# Patient Record
Sex: Female | Born: 1965 | Race: Black or African American | Hispanic: No | Marital: Married | Smoking: Never smoker
Health system: Southern US, Community
[De-identification: ages and names within clinical notes are randomized; demographics above are authoritative.]

## PROBLEM LIST (undated history)

## (undated) DIAGNOSIS — D219 Benign neoplasm of connective and other soft tissue, unspecified: Secondary | ICD-10-CM

## (undated) DIAGNOSIS — D72819 Decreased white blood cell count, unspecified: Secondary | ICD-10-CM

## (undated) DIAGNOSIS — D649 Anemia, unspecified: Secondary | ICD-10-CM

## (undated) DIAGNOSIS — G43909 Migraine, unspecified, not intractable, without status migrainosus: Secondary | ICD-10-CM

## (undated) DIAGNOSIS — R87619 Unspecified abnormal cytological findings in specimens from cervix uteri: Secondary | ICD-10-CM

## (undated) DIAGNOSIS — K7689 Other specified diseases of liver: Secondary | ICD-10-CM

## (undated) DIAGNOSIS — Z8719 Personal history of other diseases of the digestive system: Secondary | ICD-10-CM

## (undated) DIAGNOSIS — L821 Other seborrheic keratosis: Secondary | ICD-10-CM

## (undated) DIAGNOSIS — Z923 Personal history of irradiation: Secondary | ICD-10-CM

## (undated) DIAGNOSIS — D171 Benign lipomatous neoplasm of skin and subcutaneous tissue of trunk: Secondary | ICD-10-CM

## (undated) DIAGNOSIS — C801 Malignant (primary) neoplasm, unspecified: Secondary | ICD-10-CM

## (undated) HISTORY — PX: TUBAL LIGATION: SHX77

## (undated) HISTORY — DX: Unspecified abnormal cytological findings in specimens from cervix uteri: R87.619

## (undated) HISTORY — DX: Migraine, unspecified, not intractable, without status migrainosus: G43.909

## (undated) HISTORY — DX: Anemia, unspecified: D64.9

## (undated) HISTORY — DX: Benign neoplasm of connective and other soft tissue, unspecified: D21.9

## (undated) HISTORY — PX: ESOPHAGOGASTRODUODENOSCOPY: SHX1529

---
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
## 1984-03-06 HISTORY — PX: APPENDECTOMY: SHX54

## 1998-06-09 ENCOUNTER — Emergency Department (HOSPITAL_COMMUNITY): Admission: EM | Admit: 1998-06-09 | Discharge: 1998-06-09 | Payer: Self-pay | Admitting: Emergency Medicine

## 1998-06-09 ENCOUNTER — Encounter: Payer: Self-pay | Admitting: Emergency Medicine

## 1998-06-22 ENCOUNTER — Other Ambulatory Visit: Admission: RE | Admit: 1998-06-22 | Discharge: 1998-06-22 | Payer: Self-pay | Admitting: Obstetrics and Gynecology

## 1999-07-22 ENCOUNTER — Other Ambulatory Visit: Admission: RE | Admit: 1999-07-22 | Discharge: 1999-07-22 | Payer: Self-pay | Admitting: Obstetrics and Gynecology

## 2007-04-25 ENCOUNTER — Emergency Department (HOSPITAL_COMMUNITY): Admission: EM | Admit: 2007-04-25 | Discharge: 2007-04-25 | Payer: Self-pay | Admitting: Emergency Medicine

## 2010-11-28 LAB — URINALYSIS, ROUTINE W REFLEX MICROSCOPIC
Bilirubin Urine: NEGATIVE
Glucose, UA: NEGATIVE
Ketones, ur: NEGATIVE
Nitrite: POSITIVE — AB
Protein, ur: 30 — AB
Specific Gravity, Urine: 1.014
Urobilinogen, UA: 0.2
pH: 6

## 2010-11-28 LAB — URINE MICROSCOPIC-ADD ON

## 2010-11-28 LAB — URINE CULTURE: Colony Count: >=100000

## 2011-03-10 ENCOUNTER — Ambulatory Visit: Payer: Self-pay | Admitting: Family Medicine

## 2011-05-19 ENCOUNTER — Encounter (HOSPITAL_COMMUNITY): Payer: Self-pay | Admitting: *Deleted

## 2011-05-19 ENCOUNTER — Emergency Department (HOSPITAL_COMMUNITY)
Admission: EM | Admit: 2011-05-19 | Discharge: 2011-05-20 | Disposition: A | Discharge status: Home / Self Care | Payer: 59 | Attending: Emergency Medicine | Admitting: Emergency Medicine

## 2011-05-19 DIAGNOSIS — R51 Headache: Secondary | ICD-10-CM | POA: Insufficient documentation

## 2011-05-19 DIAGNOSIS — R110 Nausea: Secondary | ICD-10-CM | POA: Insufficient documentation

## 2011-05-19 DIAGNOSIS — R42 Dizziness and giddiness: Secondary | ICD-10-CM | POA: Insufficient documentation

## 2011-05-19 DIAGNOSIS — D649 Anemia, unspecified: Secondary | ICD-10-CM | POA: Insufficient documentation

## 2011-05-19 DIAGNOSIS — R209 Unspecified disturbances of skin sensation: Secondary | ICD-10-CM | POA: Insufficient documentation

## 2011-05-19 NOTE — ED Notes (Signed)
Pt states that she began to have a right sided headache x 5 days ago that has gotten progressively worse and worsens at the end of the day.  Pt states that the headache seems to be affecting the vision in her right eye.  Pt states she is having visual disturbances in her right eye.  Pt exhibits no neurological deficits.  Pt is noted to have a steady gate and he speech is of normal tone and cadence.  Pt states that she is also having a heavy and extended period.

## 2011-05-20 ENCOUNTER — Emergency Department (HOSPITAL_COMMUNITY): Payer: 59

## 2011-05-20 LAB — BASIC METABOLIC PANEL
Calcium: 9.2 mg/dL (ref 8.4–10.5)
GFR calc Af Amer: 73 mL/min — ABNORMAL LOW (ref >90)
GFR calc non Af Amer: 63 mL/min — ABNORMAL LOW (ref >90)
Glucose, Bld: 104 mg/dL — ABNORMAL HIGH (ref 70–99)
Sodium: 134 mEq/L — ABNORMAL LOW (ref 135–145)

## 2011-05-20 LAB — DIFFERENTIAL
Basophils Relative: 1 % (ref 0–1)
Eosinophils Absolute: 0.2 10*3/uL (ref 0.0–0.7)
Eosinophils Relative: 4 % (ref 0–5)
Lymphs Abs: 2.5 10*3/uL (ref 0.7–4.0)
Neutrophils Relative %: 39 % — ABNORMAL LOW (ref 43–77)

## 2011-05-20 LAB — CBC
MCH: 24.9 pg — ABNORMAL LOW (ref 26.0–34.0)
MCHC: 32.1 g/dL (ref 30.0–36.0)
MCV: 77.7 fL — ABNORMAL LOW (ref 78.0–100.0)
Platelets: 281 10*3/uL (ref 150–400)
RBC: 3.73 MIL/uL — ABNORMAL LOW (ref 3.87–5.11)
RDW: 17.9 % — ABNORMAL HIGH (ref 11.5–15.5)

## 2011-05-20 MED ORDER — FERROUS SULFATE 325 (65 FE) MG PO TABS: 325.0000 mg | ORAL_TABLET | Freq: Every day | ORAL | Status: DC

## 2011-05-20 MED ORDER — DEXAMETHASONE SODIUM PHOSPHATE 10 MG/ML IJ SOLN
10.0000 mg | Freq: Once | INTRAMUSCULAR | Status: AC
  Administered 2011-05-20: 10 mg via INTRAVENOUS
  Filled 2011-05-20: qty 1

## 2011-05-20 MED ORDER — DIPHENHYDRAMINE HCL 50 MG/ML IJ SOLN
25.0000 mg | Freq: Once | INTRAMUSCULAR | Status: AC
  Administered 2011-05-20: 25 mg via INTRAVENOUS
  Filled 2011-05-20: qty 1

## 2011-05-20 MED ORDER — METOCLOPRAMIDE HCL 5 MG/ML IJ SOLN
10.0000 mg | Freq: Once | INTRAMUSCULAR | Status: AC
  Administered 2011-05-20: 10 mg via INTRAVENOUS
  Filled 2011-05-20: qty 2

## 2011-05-20 NOTE — ED Provider Notes (Signed)
History     CSN: [id number]  Arrival date & time 05/19/11  2004   First MD Initiated Contact with Patient 05/20/11 0012      Chief Complaint  Patient presents with  . Headache     HPI  History provided by the patient. Patient is a 46 year old female with no significant past medical history who presents with complaints of rt sided headache for the past 5 days.  Pain is located primarily in posterior head and radiates around right side to temple area.  HA has been persistent and gradually worsening.  Pain is especially bad in evening time.  Pt has taken OTC pain meds with no significant relief.  Pain has been associated at times with blurry vision from rt eye and one episodes of N/V yesterday.  Pt does mention that headaches do feel similar to HA she had gotten when she was a teenager.  Pt denies any other associated symptoms.  No fever, chills, sweats, confusion, changes in speech, numbness or weakness in extremities.  Pt also mentions that she feels she may be going through early menopause.  She reports having long periods of vaginal bleeding up to 3 wks over the past 6 months to year.  Currently she denies vaginal bleeding.  She does state that she was concerned her blood levels may be low and adding to her HA symptoms.    History reviewed. No pertinent past medical history.  Past Surgical History  Procedure Date  . Breast lumpectomy     bilateral  . Cesarean section     x 3  . Appendectomy     No family history on file.  History  Substance Use Topics  . Smoking status: Not on file  . Smokeless tobacco: Not on file  . Alcohol Use:     OB History    Grav Para Term Preterm Abortions TAB SAB Ect Mult Living                  Review of Systems  Constitutional: Negative for fever and chills.  Eyes: Positive for visual disturbance. Negative for photophobia.  Cardiovascular: Negative for chest pain.  Gastrointestinal: Positive for nausea. Negative for abdominal pain.    Neurological: Positive for headaches. Negative for dizziness, syncope, speech difficulty, weakness, light-headedness and numbness.  All other systems reviewed and are negative.    Allergies  Review of patient's allergies indicates no known allergies.  Home Medications   Current Outpatient Rx  Name Route Sig Dispense Refill  . IBUPROFEN 200 MG PO TABS Oral Take 400 mg by mouth every 6 (six) hours as needed.      BP 115/71  Pulse 58  Temp(Src) 98.7 F (37.1 C) (Oral)  Resp 14  Wt 133 lb 6.4 oz (60.51 kg)  SpO2 100%  LMP 05/12/2011  Physical Exam  Nursing note and vitals reviewed. Constitutional: She is oriented to person, place, and time. She appears well-developed and well-nourished. No distress.  HENT:  Head: Normocephalic and atraumatic.  Mouth/Throat: Oropharynx is clear and moist.  Eyes: Conjunctivae are normal. Pupils are equal, round, and reactive to light.  Neck: Normal range of motion. Neck supple.       No meningeal signs  Cardiovascular: Normal rate and regular rhythm.   Pulmonary/Chest: Effort normal and breath sounds normal. No respiratory distress. She has no rales.  Abdominal: Soft. She exhibits no distension. There is no tenderness. There is no rebound.  Neurological: She is alert and oriented to person, place,  and time. She has normal strength. No cranial nerve deficit or sensory deficit. Coordination and gait normal.  Skin: Skin is warm and dry. No rash noted.  Psychiatric: She has a normal mood and affect. Her behavior is normal.    ED Course  Procedures   Results for orders placed during the hospital encounter of 05/19/11  CBC      Component Value Range   WBC 4.9  4.0 - 10.5 (K/uL)   RBC 3.73 (*) 3.87 - 5.11 (MIL/uL)   Hemoglobin 9.3 (*) 12.0 - 15.0 (g/dL)   HCT 19.1 (*) 47.8 - 46.0 (%)   MCV 77.7 (*) 78.0 - 100.0 (fL)   MCH 24.9 (*) 26.0 - 34.0 (pg)   MCHC 32.1  30.0 - 36.0 (g/dL)   RDW 29.5 (*) 62.1 - 15.5 (%)   Platelets 281  150 - 400  (K/uL)  DIFFERENTIAL      Component Value Range   Neutrophils Relative 39 (*) 43 - 77 (%)   Neutro Abs 1.9  1.7 - 7.7 (K/uL)   Lymphocytes Relative 51 (*) 12 - 46 (%)   Lymphs Abs 2.5  0.7 - 4.0 (K/uL)   Monocytes Relative 6  3 - 12 (%)   Monocytes Absolute 0.3  0.1 - 1.0 (K/uL)   Eosinophils Relative 4  0 - 5 (%)   Eosinophils Absolute 0.2  0.0 - 0.7 (K/uL)   Basophils Relative 1  0 - 1 (%)   Basophils Absolute 0.0  0.0 - 0.1 (K/uL)  BASIC METABOLIC PANEL      Component Value Range   Sodium 134 (*) 135 - 145 (mEq/L)   Potassium 3.7  3.5 - 5.1 (mEq/L)   Chloride 102  96 - 112 (mEq/L)   CO2 27  19 - 32 (mEq/L)   Glucose, Bld 104 (*) 70 - 99 (mg/dL)   BUN 16  6 - 23 (mg/dL)   Creatinine, Ser 3.08  0.50 - 1.10 (mg/dL)   Calcium 9.2  8.4 - 65.7 (mg/dL)   GFR calc non Af Amer 63 (*) >90 (mL/min)   GFR calc Af Amer 73 (*) >90 (mL/min)      Ct Head Wo Contrast  05/20/2011  *RADIOLOGY REPORT*  Clinical Data: Right occipital headache; left leg numbness and dizziness.  CT HEAD WITHOUT CONTRAST  Technique:  Contiguous axial images were obtained from the base of the skull through the vertex without contrast.  Comparison: None.  Findings: There is no evidence of acute infarction, mass lesion, or intra- or extra-axial hemorrhage on CT.  A small chronic lacunar infarct is noted within the left putamen.  The posterior fossa, including the cerebellum, brainstem and fourth ventricle, is within normal limits.  The third and lateral ventricles are unremarkable in appearance.  The cerebral hemispheres are symmetric in appearance, with normal gray-white differentiation.  No mass effect or midline shift is seen.  There is no evidence of fracture; visualized osseous structures are unremarkable in appearance.  The visualized portions of the orbits are within normal limits.  The paranasal sinuses and mastoid air cells are well-aerated.  No significant soft tissue abnormalities are seen.  IMPRESSION:  1.  No  acute intracranial pathology seen on CT. 2.  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  Original Report Authenticated By: Tonia Ghent, M.D.     1. Headache   2. Anemia       MDM  Patient seen and evaluated. Patient in no acute distress. Patient with normal  nonfocal neuro exam.   Patient discussed with attending physician. CT scan unremarkable for acute findings. Symptoms seem similar to a complex migraine type headache. Plan to treat headache symptoms. Patient with slight anemia with no prior labs for comparison. Patient currently denies any menstrual bleeding. Will advise patient on iron rich diet and possible ion supplements given low MCV. Patient denies any lightheadedness or near syncopal symptoms.        Angus Seller, [place] 05/21/11 [phone]

## 2011-05-20 NOTE — Discharge Instructions (Signed)
Your CAT scan today do not show any concerning signs to explain your symptoms of headache. Your lab tests today did show you have a slight anemia and low blood counts. It is recommended that you eat a diet high in iron and consider taking an iron supplement for your symptoms. Please followup to primary care provider for continued evaluation and recheck severe blood levels. If you develop any worsening symptoms, persistent or increased headache pain, fever, chills, numbness or weakness in extremities please return to the emergency room.   Anemia, Frequently Asked Questions WHAT ARE THE SYMPTOMS OF ANEMIA?  Headache.   Difficulty thinking.   Fatigue.   Shortness of breath.   Weakness.   Rapid heartbeat.  AT WHAT POINT ARE PEOPLE CONSIDERED ANEMIC?  This varies with gender and age.   Both hemoglobin (Hgb) and hematocrit values are used to define anemia. These lab values are obtained from a complete blood count (CBC) test. This is performed at a caregiver's office.   The normal range of hemoglobin values for adult men is 14.0 g/dL to 16.1 g/dL. For nonpregnant women, values are 12.3 g/dL to 09.6 g/dL.   The World Health Organization defines anemia as less than 12 g/dL for nonpregnant women and less than 13 g/dL for men.   For adult males, the average normal hematocrit is 46%, and the range is 40% to 52%.   For adult females, the average normal hematocrit is 41%, and the range is 35% to 47%.   Values that fall below the lower limits can be a sign of anemia and should have further checking (evaluation).  GROUPS OF PEOPLE WHO ARE AT RISK FOR DEVELOPING ANEMIA INCLUDE:   Infants who are breastfed or taking a formula that is not fortified with iron.   Children going through a rapid growth spurt. The iron available can not keep up with the needs for a red cell mass which must grow with the child.   Women in childbearing years. They need iron because of blood loss during menstruation.    Pregnant women. The growing fetus creates a high demand for iron.   People with ongoing gastrointestinal blood loss are at risk of developing iron deficiency.   Individuals with leukemia or cancer who must receive chemotherapy or radiation to treat their disease. The drugs or radiation used to treat these diseases often decreases the bone marrow's ability to make cells of all classes. This includes red blood cells, white blood cells, and platelets.   Individuals with chronic inflammatory conditions such as rheumatoid arthritis or chronic infections.   The elderly.  ARE SOME TYPES OF ANEMIA INHERITED?   Yes, some types of anemia are due to inherited or genetic defects.   Sickle cell anemia. This occurs most often in people of African, African American, and Mediterranean descent.   Thalassemia (or Cooley's anemia). This type is found in people of Mediterranean and Southeast Asian descent. These types of anemia are common.   [place]i. This is rare.  CAN CERTAIN MEDICATIONS CAUSE A PERSON TO BECOME ANEMIC?  Yes. For example, drugs to fight cancer (chemotherapeutic agents) often cause anemia. These drugs can slow the bone marrow's ability to make red blood cells. If there are not enough red blood cells, the body does not get enough oxygen. WHAT HEMATOCRIT LEVEL IS REQUIRED TO DONATE BLOOD?  The lower limit of an acceptable hematocrit for blood donors is 38%. If you have a low hematocrit value, you should schedule an appointment with your caregiver.  ARE BLOOD TRANSFUSIONS COMMONLY USED TO CORRECT ANEMIA, AND ARE THEY DANGEROUS?  They are used to treat anemia as a last resort. Your caregiver will find the cause of the anemia and correct it if possible. Most blood transfusions are given because of excessive bleeding at the time of surgery, with trauma, or because of bone marrow suppression in patients with cancer or leukemia on chemotherapy. Blood transfusions are safer than ever before. We also  know that blood transfusions affect the immune system and may increase certain risks. There is also a concern for human error. In 1/16,000 transfusions, a patient receives a transfusion of blood that is not matched with his or her blood type.  WHAT IS IRON DEFICIENCY ANEMIA AND CAN I CORRECT IT BY CHANGING MY DIET?  Iron is an essential part of hemoglobin. Without enough hemoglobin, anemia develops and the body does not get the right amount of oxygen. Iron deficiency anemia develops after the body has had a low level of iron for a long time. This is either caused by blood loss, not taking in or absorbing enough iron, or increased demands for iron (like pregnancy or rapid growth).  Foods from animal origin such as beef, chicken, and pork, are good sources of iron. Be sure to have one of these foods at each meal. Vitamin C helps your body absorb iron. Foods rich in Vitamin C include citrus, bell pepper, strawberries, spinach and cantaloupe. In some cases, iron supplements may be needed in order to correct the iron deficiency. In the case of poor absorption, extra iron may have to be given directly into the vein through a needle (intravenously). I HAVE BEEN DIAGNOSED WITH IRON DEFICIENCY ANEMIA AND MY CAREGIVER PRESCRIBED IRON SUPPLEMENTS. HOW LONG WILL IT TAKE FOR MY BLOOD TO BECOME NORMAL?  It depends on the degree of anemia at the beginning of treatment. Most people with mild to moderate iron deficiency, anemia will correct the anemia over a period of 2 to 3 months. But after the anemia is corrected, the iron stored by the body is still low. Caregivers often suggest an additional 6 months of oral iron therapy once the anemia has been reversed. This will help prevent the iron deficiency anemia from quickly happening again. Non-anemic adult males should take iron supplements only under the direction of a doctor, too much iron can cause liver damage.  MY HEMOGLOBIN IS 9 G/DL AND I AM SCHEDULED FOR SURGERY. SHOULD  I POSTPONE THE SURGERY?  If you have Hgb of 9, you should discuss this with your caregiver right away. Many patients with similar hemoglobin levels have had surgery without problems. If minimal blood loss is expected for a minor procedure, no treatment may be necessary.  If a greater blood loss is expected for more extensive procedures, you should ask your caregiver about being treated with erythropoietin and iron. This is to accelerate the recovery of your hemoglobin to a normal level before surgery. An anemic patient who undergoes high-blood-loss surgery has a greater risk of surgical complications and need for a blood transfusion, which also carries some risk.  I HAVE BEEN TOLD THAT HEAVY MENSTRUAL PERIODS CAUSE ANEMIA. IS THERE ANYTHING I CAN DO TO PREVENT THE ANEMIA?  Anemia that results from heavy periods is usually due to iron deficiency. You can try to meet the increased demands for iron caused by the heavy monthly blood loss by increasing the intake of iron-rich foods. Iron supplements may be required. Discuss your concerns with your caregiver. WHAT  CAUSES ANEMIA DURING PREGNANCY?  Pregnancy places major demands on the body. The mother must meet the needs of both her body and her growing baby. The body needs enough iron and folate to make the right amount of red blood cells. To prevent anemia while pregnant, the mother should stay in close contact with her caregiver.  Be sure to eat a diet that has foods rich in iron and folate like liver and dark green leafy vegetables. Folate plays an important role in the normal development of a baby's spinal cord. Folate can help prevent serious disorders like spina bifida. If your diet does not provide adequate nutrients, you may want to talk with your caregiver about nutritional supplements.  WHAT IS THE RELATIONSHIP BETWEEN FIBROID TUMORS AND ANEMIA IN WOMEN?  The relationship is usually caused by the increased menstrual blood loss caused by fibroids. Good  iron intake may be required to prevent iron deficiency anemia from developing.  Document Released: 09/29/2003 Document Revised: 02/09/2011 Document Reviewed: 03/15/2010 [facility] Patient Information 2012 [place], [place].  Headache, General, Unknown Cause The specific cause of your headache may not have been found today. There are many causes and types of headache. A few common ones are:  Tension headache.   Migraine.   Infections (examples: dental and sinus infections).   Bone and/or joint problems in the neck or jaw.   Depression.   Eye problems.  These headaches are not life threatening.  Headaches can sometimes be diagnosed by a patient history and a physical exam. Sometimes, lab and imaging studies (such as x-ray and/or CT scan) are used to rule out more serious problems. In some cases, a spinal tap (lumbar puncture) may be requested. There are many times when your exam and tests may be normal on the first visit even when there is a serious problem causing your headaches. Because of that, it is very important to follow up with your doctor or local clinic for further evaluation. FINDING OUT THE RESULTS OF TESTS  If a radiology test was performed, a radiologist will review your results.   You will be contacted by the emergency department or your physician if any test results require a change in your treatment plan.   Not all test results may be available during your visit. If your test results are not back during the visit, make an appointment with your caregiver to find out the results. Do not assume everything is normal if you have not heard from your caregiver or the medical facility. It is important for you to follow up on all of your test results.  HOME CARE INSTRUCTIONS   Keep follow-up appointments with your caregiver, or any specialist referral.   Only take over-the-counter or prescription medicines for pain, discomfort, or fever as directed by your caregiver.   Biofeedback,  massage, or other relaxation techniques may be helpful.   Ice packs or heat applied to the head and neck can be used. Do this three to four times per day, or as needed.   Call your doctor if you have any questions or concerns.   If you smoke, you should quit.  SEEK MEDICAL CARE IF:   You develop problems with medications prescribed.   You do not respond to or obtain relief from medications.   You have a change from the usual headache.   You develop nausea or vomiting.  SEEK IMMEDIATE MEDICAL CARE IF:   If your headache becomes severe.   You have an unexplained oral temperature above  102 F (38.9 C), or as your caregiver suggests.   You have a stiff neck.   You have loss of vision.   You have muscular weakness.   You have loss of muscular control.   You develop severe symptoms different from your first symptoms.   You start losing your balance or have trouble walking.   You feel faint or pass out.  MAKE SURE YOU:   Understand these instructions.   Will watch your condition.   Will get help right away if you are not doing well or get worse.  Document Released: 02/20/2005 Document Revised: 02/09/2011 Document Reviewed: 10/10/2007 [facility] Patient Information 2012 [place], [place].

## 2011-05-26 NOTE — ED Provider Notes (Signed)
Medical screening examination/treatment/procedure(s) were performed by non-physician practitioner and as supervising physician I was immediately available for consultation/collaboration.  Anubis Fundora, MD 05/26/11 1926 

## 2012-03-06 DIAGNOSIS — D649 Anemia, unspecified: Secondary | ICD-10-CM

## 2012-03-06 HISTORY — DX: Anemia, unspecified: D64.9

## 2012-03-06 HISTORY — PX: ABDOMINAL HYSTERECTOMY: SHX81

## 2012-06-26 ENCOUNTER — Telehealth: Payer: Self-pay | Admitting: Obstetrics & Gynecology

## 2012-06-26 NOTE — Telephone Encounter (Signed)
Left message to return call 

## 2012-06-27 NOTE — Telephone Encounter (Signed)
PATIENT STATES HAS HAD UTI, TOOK OTC MEDS AZO, APPT. GIVEN 06/28/2012 @ 12:15pm

## 2012-06-28 ENCOUNTER — Ambulatory Visit: Payer: Self-pay | Admitting: Obstetrics and Gynecology

## 2012-06-28 ENCOUNTER — Ambulatory Visit: Payer: Self-pay | Admitting: Gynecology

## 2012-07-01 ENCOUNTER — Ambulatory Visit (INDEPENDENT_AMBULATORY_CARE_PROVIDER_SITE_OTHER): Payer: BC Managed Care – PPO | Admitting: Gynecology

## 2012-07-01 ENCOUNTER — Encounter: Payer: Self-pay | Admitting: Gynecology

## 2012-07-01 VITALS — BP 116/74 | Temp 97.6°F | Resp 14

## 2012-07-01 DIAGNOSIS — R300 Dysuria: Secondary | ICD-10-CM

## 2012-07-01 DIAGNOSIS — D509 Iron deficiency anemia, unspecified: Secondary | ICD-10-CM

## 2012-07-01 DIAGNOSIS — D259 Leiomyoma of uterus, unspecified: Secondary | ICD-10-CM | POA: Insufficient documentation

## 2012-07-01 LAB — POCT URINALYSIS DIPSTICK
Blood, UA: 2
Urobilinogen, UA: NEGATIVE
pH, UA: 5

## 2012-07-01 LAB — CBC
Hemoglobin: 13.1 g/dL (ref 12.0–15.0)
MCH: 28.5 pg (ref 26.0–34.0)
MCV: 85 fL (ref 78.0–100.0)
RBC: 4.59 MIL/uL (ref 3.87–5.11)

## 2012-07-01 MED ORDER — NITROFURANTOIN MONOHYD MACRO 100 MG PO CAPS: 100.0000 mg | ORAL_CAPSULE | Freq: Two times a day (BID) | ORAL | Status: DC

## 2012-07-01 MED ORDER — NORETHINDRONE ACETATE 5 MG PO TABS: 5.0000 mg | ORAL_TABLET | Freq: Every day | ORAL | Status: DC

## 2012-07-01 NOTE — Patient Instructions (Signed)
Urinary Tract Infection Urinary tract infections (UTIs) can develop anywhere along your urinary tract. Your urinary tract is your body's drainage system for removing wastes and extra water. Your urinary tract includes two kidneys, two ureters, a bladder, and a urethra. Your kidneys are a pair of bean-shaped organs. Each kidney is about the size of your fist. They are located below your ribs, one on each side of your spine. CAUSES Infections are caused by microbes, which are microscopic organisms, including fungi, viruses, and bacteria. These organisms are so small that they can only be seen through a microscope. Bacteria are the microbes that most commonly cause UTIs. SYMPTOMS  Symptoms of UTIs may vary by age and gender of the patient and by the location of the infection. Symptoms in young women typically include a frequent and intense urge to urinate and a painful, burning feeling in the bladder or urethra during urination. Older women and men are more likely to be tired, shaky, and weak and have muscle aches and abdominal pain. A fever may mean the infection is in your kidneys. Other symptoms of a kidney infection include pain in your back or sides below the ribs, nausea, and vomiting. DIAGNOSIS To diagnose a UTI, your caregiver will ask you about your symptoms. Your caregiver also will ask to provide a urine sample. The urine sample will be tested for bacteria and white blood cells. White blood cells are made by your body to help fight infection. TREATMENT  Typically, UTIs can be treated with medication. Because most UTIs are caused by a bacterial infection, they usually can be treated with the use of antibiotics. The choice of antibiotic and length of treatment depend on your symptoms and the type of bacteria causing your infection. HOME CARE INSTRUCTIONS  If you were prescribed antibiotics, take them exactly as your caregiver instructs you. Finish the medication even if you feel better after you  have only taken some of the medication.  Drink enough water and fluids to keep your urine clear or pale yellow.  Avoid caffeine, tea, and carbonated beverages. They tend to irritate your bladder.  Empty your bladder often. Avoid holding urine for long periods of time.  Empty your bladder before and after sexual intercourse.  After a bowel movement, women should cleanse from front to back. Use each tissue only once. SEEK MEDICAL CARE IF:   You have back pain.  You develop a fever.  Your symptoms do not begin to resolve within 3 days. SEEK IMMEDIATE MEDICAL CARE IF:   You have severe back pain or lower abdominal pain.  You develop chills.  You have nausea or vomiting.  You have continued burning or discomfort with urination. MAKE SURE YOU:   Understand these instructions.  Will watch your condition.  Will get help right away if you are not doing well or get worse. Document Released: 11/30/2004 Document Revised: 08/22/2011 Document Reviewed: 03/31/2011 [employer] Patient Information 2013 [employer].  

## 2012-07-01 NOTE — Progress Notes (Signed)
  Subjective:    Samantha West is a 47 y.o. female who complains of dysuria. She has had symptoms for 7 days. Patient just started cycle today. Patient denies pelvic pain, frequency or urgency. Patient does not have a history of recurrent UTI. Patient does not have a history of pyelonephritis. Pt feeling somewhat better since starting azo OTC. And cranberry tablet. Pt reports menses q2-3 month, variable flow but 4d, pt reports bloating and fullness and heaviness.  Pt did not follow up for her anemia, still taking iron daily  The following portions of the patient's history were reviewed and updated as appropriate: allergies, current medications, past family history, past medical history, past social history, past surgical history and problem list.  Review of Systems Pertinent items are noted in HPI.    Objective:    BP 116/74  Temp(Src) 97.6 F (36.4 C)  Resp 14  LMP 07/01/2012 General appearance: alert, cooperative and appears stated age Pelvic: cervix normal in appearance, external genitalia normal, no adnexal masses or tenderness, no bladder tenderness, no cervical motion tenderness and uterus bulky consistant with fibroids- approx 12w week, nontender  Laboratory:  Urine dipstick: negative for glucose, 2+ for hemoglobin, negative for ketones, 2+ for leukocyte esterase, negative for nitrites, trace for protein and negative for urobilinogen.   Micro exam: not done.    Assessment:    Acute cystitis   Fibroid uterus with anemia   Plan:    Medications: nitrofurantoin. Maintain adequate hydration. Follow up if symptoms not improving, and as needed.  Discussed follow up regarding her anemia and symptomatic fibroids- pt would like to schedule for after daughter's wedding in May, discussed robotic hyst as she has had 3 c/s, briefly discussed procedure.  Will check CBC now

## 2012-07-03 ENCOUNTER — Other Ambulatory Visit: Payer: Self-pay | Admitting: Gynecology

## 2012-07-03 DIAGNOSIS — N390 Urinary tract infection, site not specified: Secondary | ICD-10-CM

## 2012-07-03 LAB — CULTURE, URINE COMPREHENSIVE

## 2012-07-03 MED ORDER — CIPROFLOXACIN HCL 500 MG PO TABS: 500.0000 mg | ORAL_TABLET | Freq: Two times a day (BID) | ORAL | Status: DC

## 2012-07-04 ENCOUNTER — Telehealth: Payer: Self-pay | Admitting: *Deleted

## 2012-07-04 NOTE — Telephone Encounter (Signed)
Left Message To Call Back  

## 2012-07-04 NOTE — Telephone Encounter (Signed)
Message copied by Lorraine Lax on Thu Jul 04, 2012  1:30 PM ------      Message from: Douglass Rivers      Created: Wed Jul 03, 2012  9:56 PM       Pt given macrobid at office visit, not sensitive,  called in cipro,  D/c other abx ------

## 2012-07-08 ENCOUNTER — Telehealth: Payer: Self-pay | Admitting: *Deleted

## 2012-07-08 NOTE — Telephone Encounter (Signed)
Differences between hysterectomy and myomectomy were reviewed, pt is not interested in childbearing, pt feels pressure and bloating above her pant line consistent with her fibroid on her last exam. Questions addressed

## 2012-07-08 NOTE — Telephone Encounter (Signed)
Dr Farrel Gobble spoke to patient. Left new message on VM to check on date preferences for surgery.

## 2012-07-08 NOTE — Telephone Encounter (Signed)
Call to patient to check on date preferences for surgery.  Patient states she needs to discuss surgery with Dr Farrel Gobble as she does not understand why we are scheduling hysterectomy instead of "removing fibroid".  Please call patient to clarify.

## 2012-07-09 ENCOUNTER — Telehealth: Payer: Self-pay | Admitting: *Deleted

## 2012-07-09 NOTE — Telephone Encounter (Signed)
Left Message To Call Back  

## 2012-07-09 NOTE — Telephone Encounter (Signed)
Left Message To Call Back Re: leaving urine for toc thurs/frid

## 2012-07-11 NOTE — Telephone Encounter (Signed)
Pt.notified of lab results--see result note. 

## 2012-07-15 NOTE — Telephone Encounter (Signed)
Left Message To Call Back  

## 2012-07-18 NOTE — Telephone Encounter (Signed)
Patient states she ahs been sick and unable to talk so hasn't been able to return our calls.  States she will plan surgery in August but is not yet sure exactly when she wants to schedule.  She will call back next week after discussing options with employer.  Advised surgery is done on Tues and Wed and need 6-8 weeks for scheduling.

## 2012-07-24 NOTE — Telephone Encounter (Signed)
Patient wanted to speak specifically with Kinnie Scales in regards to her urine sample; she spoke with her previously.

## 2012-07-24 NOTE — Telephone Encounter (Signed)
Pt. Aware to drop off urine for urine toc per Dr. Seth Bake scheduled her for 07/25/12 @ 3:30

## 2012-07-25 ENCOUNTER — Ambulatory Visit (INDEPENDENT_AMBULATORY_CARE_PROVIDER_SITE_OTHER): Payer: BC Managed Care – PPO | Admitting: Gynecology

## 2012-07-25 VITALS — BP 100/60

## 2012-07-25 DIAGNOSIS — N390 Urinary tract infection, site not specified: Secondary | ICD-10-CM

## 2012-07-25 NOTE — Progress Notes (Signed)
Pt. Is here to leave urine to be sent for a urine culture, pt was last seen 07/01/12 for a urine culture. Per dr. Farrel Gobble pt needed to leave sample to be sent for culture. Pt says she feels much better.  Urine sent for culture.

## 2012-07-30 ENCOUNTER — Telehealth: Payer: Self-pay | Admitting: *Deleted

## 2012-07-30 NOTE — Telephone Encounter (Signed)
Left Message To Call Back  

## 2012-07-30 NOTE — Telephone Encounter (Signed)
Message copied by Lorraine Lax on Tue Jul 30, 2012  8:14 AM ------      Message from: Douglass Rivers      Created: Sat Jul 27, 2012  6:39 PM       inform ------

## 2012-08-01 NOTE — Telephone Encounter (Signed)
Left Message To Call Back  

## 2012-08-02 NOTE — Telephone Encounter (Signed)
Pt. Notified of labs 08/01/12

## 2012-08-22 ENCOUNTER — Telehealth: Payer: Self-pay | Admitting: Gynecology

## 2012-08-22 NOTE — Telephone Encounter (Signed)
Patient wants to discuss surgery.

## 2012-08-23 ENCOUNTER — Telehealth: Payer: Self-pay | Admitting: Gynecology

## 2012-08-23 NOTE — Telephone Encounter (Signed)
Patient is waiting on call to schedule surgery, please call patient is ready to schedule.

## 2012-08-23 NOTE — Telephone Encounter (Signed)
Prescription agestin  ? (generic)

## 2012-08-26 MED ORDER — NORETHINDRONE ACETATE 5 MG PO TABS: ORAL_TABLET | ORAL | Status: DC

## 2012-08-26 NOTE — Telephone Encounter (Signed)
Patient calling back about refill for Aygestin that was not called to pharmacy.  She reports that she was on Aygestin to control menses.  Menses started on 08-17-12, after completing Aygetsin and she has been bleeding since then.  Since she did not hear from [place], she restarted Aygestin that she had left over from first RX.  She took it 3 times daily on 6-21 and 08-25-12 and she is feeling the bleeding getting better.  Crampis continue but clotting id not as bad.  Needs refill on Aygestin and instructions on when and how much to continue taking.  Does not want to plan the surgery until the first week of August.

## 2012-08-26 NOTE — Telephone Encounter (Signed)
Patient wants RX to [facility].  Sent case request for 10-08-12 thru [employer].

## 2012-08-26 NOTE — Telephone Encounter (Signed)
See next telephone note regarding medication and surgery date.

## 2012-08-26 NOTE — Telephone Encounter (Signed)
Verbal per Dr. Farrel Gobble: Pt should take Aygestin TID until bleeding stops, then take BID x 1 week, then take one daily until surgery in August.   RX for #50 Aygestin escribed to [facility] per Dr. Farrel Gobble. Message left at home number for patient to return call on Tuesday, 6/24.

## 2012-08-26 NOTE — Telephone Encounter (Signed)
Patient returning Sally's call. Also reports heavy bleeding and left a message on Friday. Please advise?

## 2012-08-27 NOTE — Telephone Encounter (Signed)
Pt returned call.  Notified of Dr. Liliana Cline advice.  She is agreeable and can recited instructions.  Advised we will be calling her with final surgery details at a later date.

## 2012-08-29 ENCOUNTER — Telehealth: Payer: Self-pay | Admitting: *Deleted

## 2012-08-29 NOTE — Telephone Encounter (Signed)
Call to patient with surgical date and to give instruction. LMTCB.

## 2012-08-29 NOTE — Telephone Encounter (Signed)
See next telephone call for surgery info.

## 2012-09-13 ENCOUNTER — Encounter: Payer: Self-pay | Admitting: Gynecology

## 2012-09-13 ENCOUNTER — Ambulatory Visit (INDEPENDENT_AMBULATORY_CARE_PROVIDER_SITE_OTHER): Payer: BC Managed Care – PPO | Admitting: Gynecology

## 2012-09-13 VITALS — BP 106/62 | HR 64 | Temp 98.5°F | Ht 62.0 in | Wt 133.0 lb

## 2012-09-13 DIAGNOSIS — D259 Leiomyoma of uterus, unspecified: Secondary | ICD-10-CM

## 2012-09-13 DIAGNOSIS — D509 Iron deficiency anemia, unspecified: Secondary | ICD-10-CM

## 2012-09-13 MED ORDER — HYDROMORPHONE HCL 2 MG PO TABS: 2.0000 mg | ORAL_TABLET | ORAL | Status: DC | PRN

## 2012-09-13 MED ORDER — CELECOXIB 200 MG PO CAPS: 200.0000 mg | ORAL_CAPSULE | Freq: Two times a day (BID) | ORAL | Status: DC

## 2012-09-13 NOTE — Progress Notes (Signed)
46 y.o.SingleAfrican American [id number] female here for consideration for robotically assisted TLH  with Left salpingectomy and without removal of ovaries.    She complains of dysmenorrhea, uterine leiomyomata/fibroids, menorrhagia and anemia. PUS on November 17, 2011 showed the uterus to be enlarged and contain 3 fibroids, the largest being about 6 cm.  Adnexa Normal.  Endometrial biopsy on November 24, 2011 was degenerating secretroy endometrium.        The current method of family planning is tubal ligation.     Hormones:Progesterone: Norethindrone 5 mg BID   reports that she has never smoked. She has never used smokeless tobacco. She reports that she does not drink alcohol or use illicit drugs.  @CHLEC1NM@  Health Maintenance  Topic Date Due  . Pap Smear  09/26/1982  . Tetanus/tdap  09/26/1983  . Influenza Vaccine  11/04/2012    History reviewed. No pertinent family history.  Patient Active Problem List   Diagnosis Date Noted  . Anemia, iron deficiency 07/01/2012  . Fibroid uterus 07/01/2012    History reviewed. No pertinent past medical history.  Past Surgical History  Procedure Laterality Date  . Breast lumpectomy      bilateral  . Cesarean section      x 3  . Appendectomy      Allergies: @RRALLERGIES@  Current Outpatient Prescriptions  Medication Sig Dispense Refill  . ibuprofen (ADVIL,MOTRIN) 200 MG tablet Take 400 mg by mouth every 6 (six) hours as needed.      . IRON PO Take by mouth.      . norethindrone (AYGESTIN) 5 MG tablet Take TID until bleeding stops, then take BID x 1 week, then take QD.  50 tablet  0  . celecoxib (CELEBREX) 200 MG capsule Take 1 capsule (200 mg total) by mouth 2 (two) times daily.  3 capsule  3   No current facility-administered medications for this visit.      Exam:    BP 106/62  Pulse 64  Temp(Src) 98.5 F (36.9 C) (Oral)  Ht 5' 2" (1.575 m)  Wt 133 lb (60.328 kg)  BMI 24.32 kg/m2  LMP 08/25/2012  General appearance:  alert, cooperative and appears stated age Head: Normocephalic, without obvious abnormality, atraumatic Neck: no adenopathy, no carotid bruit, no JVD, supple, symmetrical, trachea midline and thyroid not enlarged, symmetric, no tenderness/mass/nodules Lungs: clear to auscultation bilaterally Heart: regular rate and rhythm, S1, S2 normal, no murmur, click, rub or gallop Abdomen: soft, non-tender; bowel sounds normal; no masses,  no organomegaly Extremities: extremities normal, atraumatic, no cyanosis or edema Lymph nodes: Cervical, supraclavicular, and axillary nodes normal. no inguinal nodes palpated Neurologic: Grossly normal   Pelvic: External genitalia:  no lesions              Bartholins and Skenes: normal                 Vagina: normal appearing vagina with normal color and discharge, no lesions              Cervix: normal appearance                     Bimanual Exam:  Uterus:  enlarged to 12 week's size, irregular                                      Adnexa:    Not palpable                                        Rectovaginal: Deferred                                      Anus:  defer exam   A/P:  The planned procedure was discussed with the patient.  Pre-op instructions, hospital course, and post-op instructions reviewed.  Post op instruction booklet given and reviewed.  Risks and possible complications discussed, including but not limited to, bleeding and possible transfusion; infection; anesthesia complications; injury to visceral organ requiring further surgery, either immediately or delayed; wound complications including infection, blood collections, and bowel herniation; allergic reactions; swelling of the face due to prolonged positioning;  VTE or air emboli; nerve injury related to prolonged positioning; even death.   We reviewed the latest guidelines regarding uterine morcellation for fibroids, we discussed that we may need to morcellate the fibroids to remove the uterus with a  morcellator, we discussed the risk that a fibroid can be a leiomyosarcoma and that the act of using a morcellator may result in aeration and spread of unknown sarcomatous cells and spread a potentially deadly disease, if she refuses morcellation and we are unable to morcellate the fibroids successfully vaginally we would need to make an abdominal wall incision.  Questions regarding this were addressed and pt expresses understanding  The patient watched a robotic hysterectomy on youtube prior to her office visit today.  Her questions were invited and answered.  The patient states she understands the risks and possible complications, and wishes to proceed as planned.  Consent form signed.  She was instructed to take two 200 mg celebrex capsules with a small sip of water the morning of the procedure, and then to take one bid on POD 1 and 2.  Rx given for Percocet 5/325 mg #15 to take one or two po q4-6 hours prn post op pain.    Ready for surgery.       

## 2012-09-13 NOTE — Patient Instructions (Signed)
celebrex 2 tab morning of surgery 1 pill twice a day after discharge Prescription for diluadid 1-2mg  take with motrin-600mg  Use stool softeners until 3d after last narcotic- colace 3x/d, or surfak once a day, miralax Gas-ex daily until bloating resolved, dulolax suppository as needed

## 2012-09-19 ENCOUNTER — Telehealth: Payer: Self-pay | Admitting: Gynecology

## 2012-09-19 NOTE — Telephone Encounter (Signed)
LMTCB to discuss updated amount due for surgery. Informed patient that payment date is 09/24/12 and she needs to call and verify that the payment will be received on or by the due date.

## 2012-09-23 ENCOUNTER — Telehealth: Payer: Self-pay | Admitting: Gynecology

## 2012-09-23 NOTE — Telephone Encounter (Signed)
Advised patient that payment for surgery is due tmro. She explained that she was not informed of the due date. I reminded her of our conversation on 07/04/12 when we discussed her benefits for surgery and how I explained that the amount is due in full two weeks prior to her surgery. She is able to pay the full amount but not until 7/29. Patient is very frustrated and said that we have done a poor job for handling this matter. She said she will be notifying Dr. Farrel Gobble about this matter.  Patient was called on 7/17 to remind her of her payment date and did not return a call to [place] until 7/21.

## 2012-09-24 ENCOUNTER — Telehealth: Payer: Self-pay | Admitting: Gynecology

## 2012-09-24 NOTE — Telephone Encounter (Signed)
Please advise 

## 2012-09-24 NOTE — Telephone Encounter (Signed)
Patient needs refills of Aygestin  Surgery is scheduled for 10-08-2012. Patient was taken one a day and had to take two a day. To stop bleeding. She is needing a refill in order to carry her  to her surgery date.

## 2012-09-25 ENCOUNTER — Telehealth: Payer: Self-pay | Admitting: *Deleted

## 2012-09-25 MED ORDER — NORETHINDRONE ACETATE 5 MG PO TABS: ORAL_TABLET | ORAL | Status: DC

## 2012-09-25 NOTE — Telephone Encounter (Signed)
Patient returned my call regarding an appointment.  She asked about this refill.  States had to increase Aygestin to 2 QD to slow bleeding so she ran out early.  Needs refill to get to 10-08-12 surgery date. Refill sent.

## 2012-09-25 NOTE — Telephone Encounter (Signed)
Call to patient regarding AEX appt scheduled for 09-30-12 with Dr Hyacinth Meeker.  Patient is scheduled for surgery and has had preop, AEX cn be rescheduled.

## 2012-09-25 NOTE — Telephone Encounter (Signed)
Patient is returning a call to Kennon Rounds, also patient is asking what medications she should avoid before surgery?

## 2012-09-26 NOTE — Telephone Encounter (Signed)
Called patient re: FMLA paperwork is ready for pickup after payment.

## 2012-09-27 DIAGNOSIS — Z0289 Encounter for other administrative examinations: Secondary | ICD-10-CM

## 2012-09-30 ENCOUNTER — Telehealth: Payer: Self-pay | Admitting: Gynecology

## 2012-09-30 ENCOUNTER — Ambulatory Visit: Payer: Self-pay | Admitting: Obstetrics & Gynecology

## 2012-09-30 NOTE — Telephone Encounter (Signed)
Returned call to patient. LMTCB

## 2012-09-30 NOTE — Telephone Encounter (Signed)
Patient  wants to make payment on surgery.

## 2012-10-01 ENCOUNTER — Telehealth: Payer: Self-pay | Admitting: *Deleted

## 2012-10-01 NOTE — Telephone Encounter (Signed)
Calling patient in regards to picking up Dilaudid rx for surgery.

## 2012-10-04 NOTE — Telephone Encounter (Signed)
Left Message To Call Back  

## 2012-10-04 NOTE — Telephone Encounter (Signed)
S/W pt she says she will come by sometime today and pick up rx (Dilaudid) for surgery.

## 2012-10-04 NOTE — Telephone Encounter (Signed)
Pt is returning a call to Jasmine. °

## 2012-10-07 MED ORDER — DEXTROSE 5 % IV SOLN
2.0000 g | INTRAVENOUS | Status: AC
  Administered 2012-10-08: 2 g via INTRAVENOUS
  Filled 2012-10-07: qty 2

## 2012-10-08 ENCOUNTER — Encounter (HOSPITAL_COMMUNITY): Payer: Self-pay | Admitting: *Deleted

## 2012-10-08 ENCOUNTER — Encounter (HOSPITAL_COMMUNITY)
Admission: RE | Disposition: A | Discharge status: Home / Self Care | Payer: Self-pay | Source: Ambulatory Visit | Attending: Gynecology

## 2012-10-08 ENCOUNTER — Ambulatory Visit (HOSPITAL_COMMUNITY): Payer: BC Managed Care – PPO | Admitting: Anesthesiology

## 2012-10-08 ENCOUNTER — Ambulatory Visit (HOSPITAL_COMMUNITY)
Admission: RE | Admit: 2012-10-08 | Discharge: 2012-10-09 | Disposition: A | Discharge status: Home / Self Care | Payer: BC Managed Care – PPO | Source: Ambulatory Visit | Attending: Gynecology | Admitting: Gynecology

## 2012-10-08 ENCOUNTER — Encounter (HOSPITAL_COMMUNITY): Payer: Self-pay | Admitting: Anesthesiology

## 2012-10-08 DIAGNOSIS — N920 Excessive and frequent menstruation with regular cycle: Secondary | ICD-10-CM | POA: Insufficient documentation

## 2012-10-08 DIAGNOSIS — N949 Unspecified condition associated with female genital organs and menstrual cycle: Secondary | ICD-10-CM | POA: Insufficient documentation

## 2012-10-08 DIAGNOSIS — N938 Other specified abnormal uterine and vaginal bleeding: Secondary | ICD-10-CM | POA: Insufficient documentation

## 2012-10-08 DIAGNOSIS — D509 Iron deficiency anemia, unspecified: Secondary | ICD-10-CM

## 2012-10-08 DIAGNOSIS — D252 Subserosal leiomyoma of uterus: Secondary | ICD-10-CM | POA: Insufficient documentation

## 2012-10-08 DIAGNOSIS — D500 Iron deficiency anemia secondary to blood loss (chronic): Secondary | ICD-10-CM | POA: Insufficient documentation

## 2012-10-08 DIAGNOSIS — N840 Polyp of corpus uteri: Secondary | ICD-10-CM | POA: Insufficient documentation

## 2012-10-08 DIAGNOSIS — Z9889 Other specified postprocedural states: Secondary | ICD-10-CM

## 2012-10-08 DIAGNOSIS — N946 Dysmenorrhea, unspecified: Secondary | ICD-10-CM | POA: Insufficient documentation

## 2012-10-08 DIAGNOSIS — M7989 Other specified soft tissue disorders: Secondary | ICD-10-CM

## 2012-10-08 DIAGNOSIS — D259 Leiomyoma of uterus, unspecified: Secondary | ICD-10-CM

## 2012-10-08 DIAGNOSIS — N838 Other noninflammatory disorders of ovary, fallopian tube and broad ligament: Secondary | ICD-10-CM | POA: Insufficient documentation

## 2012-10-08 DIAGNOSIS — D251 Intramural leiomyoma of uterus: Secondary | ICD-10-CM | POA: Insufficient documentation

## 2012-10-08 HISTORY — PX: CYSTOSCOPY: SHX5120

## 2012-10-08 HISTORY — PX: ROBOTIC ASSISTED TOTAL HYSTERECTOMY: SHX6085

## 2012-10-08 LAB — TYPE AND SCREEN
ABO/RH(D): O POS
Antibody Screen: NEGATIVE

## 2012-10-08 LAB — ABO/RH: ABO/RH(D): O POS

## 2012-10-08 LAB — CBC
Hemoglobin: 12.9 g/dL (ref 12.0–15.0)
Platelets: 227 10*3/uL (ref 150–400)
RBC: 4.59 MIL/uL (ref 3.87–5.11)
WBC: 4.3 10*3/uL (ref 4.0–10.5)

## 2012-10-08 SURGERY — ROBOTIC ASSISTED TOTAL HYSTERECTOMY
Anesthesia: General | Site: Urethra | Wound class: Clean Contaminated

## 2012-10-08 MED ORDER — GLYCOPYRROLATE 0.2 MG/ML IJ SOLN
INTRAMUSCULAR | Status: DC | PRN
  Administered 2012-10-08: .6 mg via INTRAVENOUS

## 2012-10-08 MED ORDER — LACTATED RINGERS IR SOLN
Status: DC | PRN
  Administered 2012-10-08: 3000 mL

## 2012-10-08 MED ORDER — ONDANSETRON HCL 4 MG/2ML IJ SOLN
INTRAMUSCULAR | Status: AC
  Filled 2012-10-08: qty 2

## 2012-10-08 MED ORDER — FENTANYL CITRATE 0.05 MG/ML IJ SOLN
INTRAMUSCULAR | Status: DC | PRN
  Administered 2012-10-08: 100 ug via INTRAVENOUS
  Administered 2012-10-08 (×5): 50 ug via INTRAVENOUS

## 2012-10-08 MED ORDER — ZOLPIDEM TARTRATE 5 MG PO TABS: 5.0000 mg | ORAL_TABLET | Freq: Every evening | ORAL | Status: DC | PRN

## 2012-10-08 MED ORDER — FENTANYL CITRATE 0.05 MG/ML IJ SOLN
INTRAMUSCULAR | Status: AC
  Administered 2012-10-08: 50 ug via INTRAVENOUS
  Filled 2012-10-08: qty 2

## 2012-10-08 MED ORDER — STERILE WATER FOR IRRIGATION IR SOLN
Status: DC | PRN
  Administered 2012-10-08: 1000 mL via INTRAVESICAL

## 2012-10-08 MED ORDER — ROPIVACAINE HCL 5 MG/ML IJ SOLN
INTRAMUSCULAR | Status: AC
  Filled 2012-10-08: qty 60

## 2012-10-08 MED ORDER — ONDANSETRON HCL 4 MG/2ML IJ SOLN: 4.0000 mg | Freq: Once | INTRAMUSCULAR | Status: DC | PRN

## 2012-10-08 MED ORDER — LIDOCAINE HCL (CARDIAC) 20 MG/ML IV SOLN
INTRAVENOUS | Status: AC
  Filled 2012-10-08: qty 5

## 2012-10-08 MED ORDER — DOCUSATE SODIUM 100 MG PO CAPS
100.0000 mg | ORAL_CAPSULE | Freq: Two times a day (BID) | ORAL | Status: DC
  Administered 2012-10-08 – 2012-10-09 (×2): 100 mg via ORAL
  Filled 2012-10-08 (×2): qty 1

## 2012-10-08 MED ORDER — TRAMADOL HCL 50 MG PO TABS: 50.0000 mg | ORAL_TABLET | Freq: Four times a day (QID) | ORAL | Status: DC | PRN

## 2012-10-08 MED ORDER — LACTATED RINGERS IV SOLN
INTRAVENOUS | Status: DC
  Administered 2012-10-08 (×3): via INTRAVENOUS

## 2012-10-08 MED ORDER — ONDANSETRON HCL 4 MG/2ML IJ SOLN: 4.0000 mg | Freq: Four times a day (QID) | INTRAMUSCULAR | Status: DC | PRN

## 2012-10-08 MED ORDER — ROCURONIUM BROMIDE 100 MG/10ML IV SOLN
INTRAVENOUS | Status: DC | PRN
  Administered 2012-10-08: 10 mg via INTRAVENOUS
  Administered 2012-10-08: 40 mg via INTRAVENOUS
  Administered 2012-10-08 (×2): 10 mg via INTRAVENOUS

## 2012-10-08 MED ORDER — LIDOCAINE HCL (CARDIAC) 20 MG/ML IV SOLN
INTRAVENOUS | Status: DC | PRN
  Administered 2012-10-08: 80 mg via INTRAVENOUS

## 2012-10-08 MED ORDER — FENTANYL CITRATE 0.05 MG/ML IJ SOLN
25.0000 ug | INTRAMUSCULAR | Status: DC | PRN
  Administered 2012-10-08 (×2): 50 ug via INTRAVENOUS

## 2012-10-08 MED ORDER — ROPIVACAINE HCL 5 MG/ML IJ SOLN
INTRAMUSCULAR | Status: DC | PRN
  Administered 2012-10-08: 60 mL

## 2012-10-08 MED ORDER — MIDAZOLAM HCL 5 MG/5ML IJ SOLN
INTRAMUSCULAR | Status: DC | PRN
  Administered 2012-10-08: 2 mg via INTRAVENOUS

## 2012-10-08 MED ORDER — CELECOXIB 100 MG PO CAPS
100.0000 mg | ORAL_CAPSULE | Freq: Two times a day (BID) | ORAL | Status: DC
  Filled 2012-10-08: qty 1

## 2012-10-08 MED ORDER — CELECOXIB 100 MG PO CAPS
100.0000 mg | ORAL_CAPSULE | Freq: Two times a day (BID) | ORAL | Status: DC
  Administered 2012-10-08: 100 mg via ORAL
  Filled 2012-10-08 (×2): qty 1

## 2012-10-08 MED ORDER — FENTANYL CITRATE 0.05 MG/ML IJ SOLN
INTRAMUSCULAR | Status: AC
  Filled 2012-10-08: qty 5

## 2012-10-08 MED ORDER — PROPOFOL 10 MG/ML IV BOLUS
INTRAVENOUS | Status: DC | PRN
  Administered 2012-10-08: 150 mg via INTRAVENOUS

## 2012-10-08 MED ORDER — LACTATED RINGERS IV SOLN
INTRAVENOUS | Status: DC
  Administered 2012-10-08: 19:00:00 via INTRAVENOUS

## 2012-10-08 MED ORDER — MIDAZOLAM HCL 2 MG/2ML IJ SOLN
INTRAMUSCULAR | Status: AC
  Filled 2012-10-08: qty 2

## 2012-10-08 MED ORDER — ROCURONIUM BROMIDE 50 MG/5ML IV SOLN
INTRAVENOUS | Status: AC
  Filled 2012-10-08: qty 1

## 2012-10-08 MED ORDER — HYDROMORPHONE HCL 2 MG PO TABS
2.0000 mg | ORAL_TABLET | ORAL | Status: DC | PRN
  Administered 2012-10-09 (×3): 2 mg via ORAL
  Filled 2012-10-08 (×3): qty 1

## 2012-10-08 MED ORDER — INDIGOTINDISULFONATE SODIUM 8 MG/ML IJ SOLN
INTRAMUSCULAR | Status: DC | PRN
  Administered 2012-10-08: 40 mg via INTRAVENOUS

## 2012-10-08 MED ORDER — MENTHOL 3 MG MT LOZG: 1.0000 | LOZENGE | OROMUCOSAL | Status: DC | PRN

## 2012-10-08 MED ORDER — PROPOFOL 10 MG/ML IV EMUL
INTRAVENOUS | Status: AC
  Filled 2012-10-08: qty 20

## 2012-10-08 MED ORDER — DEXAMETHASONE SODIUM PHOSPHATE 10 MG/ML IJ SOLN
INTRAMUSCULAR | Status: DC | PRN
  Administered 2012-10-08: 10 mg via INTRAVENOUS

## 2012-10-08 MED ORDER — KETOROLAC TROMETHAMINE 30 MG/ML IJ SOLN: 15.0000 mg | Freq: Once | INTRAMUSCULAR | Status: DC | PRN

## 2012-10-08 MED ORDER — ONDANSETRON HCL 4 MG PO TABS: 4.0000 mg | ORAL_TABLET | Freq: Four times a day (QID) | ORAL | Status: DC | PRN

## 2012-10-08 MED ORDER — ACETAMINOPHEN 10 MG/ML IV SOLN
1000.0000 mg | Freq: Once | INTRAVENOUS | Status: AC
  Administered 2012-10-08: 1000 mg via INTRAVENOUS
  Filled 2012-10-08: qty 100

## 2012-10-08 MED ORDER — SIMETHICONE 80 MG PO CHEW
160.0000 mg | CHEWABLE_TABLET | Freq: Four times a day (QID) | ORAL | Status: DC
  Administered 2012-10-08 – 2012-10-09 (×3): 160 mg via ORAL

## 2012-10-08 MED ORDER — NEOSTIGMINE METHYLSULFATE 1 MG/ML IJ SOLN
INTRAMUSCULAR | Status: AC
  Filled 2012-10-08: qty 1

## 2012-10-08 MED ORDER — NEOSTIGMINE METHYLSULFATE 1 MG/ML IJ SOLN
INTRAMUSCULAR | Status: DC | PRN
  Administered 2012-10-08: 3 mg via INTRAVENOUS

## 2012-10-08 MED ORDER — INDIGOTINDISULFONATE SODIUM 8 MG/ML IJ SOLN
INTRAMUSCULAR | Status: AC
  Filled 2012-10-08: qty 5

## 2012-10-08 MED ORDER — HYDROMORPHONE HCL PF 1 MG/ML IJ SOLN
0.2000 mg | INTRAMUSCULAR | Status: DC | PRN
  Administered 2012-10-08 (×2): 0.6 mg via INTRAVENOUS
  Filled 2012-10-08 (×2): qty 1

## 2012-10-08 MED ORDER — DEXAMETHASONE SODIUM PHOSPHATE 10 MG/ML IJ SOLN
INTRAMUSCULAR | Status: AC
  Filled 2012-10-08: qty 1

## 2012-10-08 MED ORDER — IBUPROFEN 600 MG PO TABS
ORAL_TABLET | ORAL | Status: AC
  Filled 2012-10-08: qty 1

## 2012-10-08 MED ORDER — ONDANSETRON HCL 4 MG/2ML IJ SOLN
INTRAMUSCULAR | Status: DC | PRN
  Administered 2012-10-08: 4 mg via INTRAVENOUS

## 2012-10-08 MED ORDER — LACTATED RINGERS IV SOLN
INTRAVENOUS | Status: DC
  Administered 2012-10-08: 07:00:00 via INTRAVENOUS

## 2012-10-08 MED ORDER — FENTANYL CITRATE 0.05 MG/ML IJ SOLN
INTRAMUSCULAR | Status: AC
  Filled 2012-10-08: qty 2

## 2012-10-08 MED ORDER — MEPERIDINE HCL 25 MG/ML IJ SOLN: 6.2500 mg | INTRAMUSCULAR | Status: DC | PRN

## 2012-10-08 MED ORDER — GLYCOPYRROLATE 0.2 MG/ML IJ SOLN
INTRAMUSCULAR | Status: AC
  Filled 2012-10-08: qty 3

## 2012-10-08 SURGICAL SUPPLY — 76 items
ADH SKN CLS APL DERMABOND .7 (GAUZE/BANDAGES/DRESSINGS) ×2
APL SKNCLS STERI-STRIP NONHPOA (GAUZE/BANDAGES/DRESSINGS) ×2
BAG URINE DRAINAGE (UROLOGICAL SUPPLIES) ×3 IMPLANT
BARRIER ADHS 3X4 INTERCEED (GAUZE/BANDAGES/DRESSINGS) ×3 IMPLANT
BENZOIN TINCTURE PRP APPL 2/3 (GAUZE/BANDAGES/DRESSINGS) ×3 IMPLANT
BRR ADH 4X3 ABS CNTRL BYND (GAUZE/BANDAGES/DRESSINGS) ×2
CHLORAPREP W/TINT 26ML (MISCELLANEOUS) ×3 IMPLANT
CONT PATH 16OZ SNAP LID 3702 (MISCELLANEOUS) ×3 IMPLANT
COVER MAYO STAND STRL (DRAPES) ×3 IMPLANT
COVER TABLE BACK 60X90 (DRAPES) ×6 IMPLANT
COVER TIP SHEARS 8 DVNC (MISCELLANEOUS) ×2 IMPLANT
COVER TIP SHEARS 8MM DA VINCI (MISCELLANEOUS) ×1
DECANTER SPIKE VIAL GLASS SM (MISCELLANEOUS) ×3 IMPLANT
DERMABOND ADVANCED (GAUZE/BANDAGES/DRESSINGS) ×1
DERMABOND ADVANCED .7 DNX12 (GAUZE/BANDAGES/DRESSINGS) IMPLANT
DRAPE HUG U DISPOSABLE (DRAPE) ×3 IMPLANT
DRAPE LG THREE QUARTER DISP (DRAPES) ×6 IMPLANT
DRAPE WARM FLUID 44X44 (DRAPE) ×3 IMPLANT
ELECT REM PT RETURN 9FT ADLT (ELECTROSURGICAL) ×3
ELECTRODE REM PT RTRN 9FT ADLT (ELECTROSURGICAL) ×2 IMPLANT
EVACUATOR SMOKE 8.L (FILTER) ×3 IMPLANT
GAUZE VASELINE 3X9 (GAUZE/BANDAGES/DRESSINGS) IMPLANT
GLOVE BIO SURGEON STRL SZ 6.5 (GLOVE) ×6 IMPLANT
GLOVE BIOGEL M 6.5 STRL (GLOVE) ×12 IMPLANT
GLOVE BIOGEL PI IND STRL 6.5 (GLOVE) ×2 IMPLANT
GLOVE BIOGEL PI IND STRL 7.0 (GLOVE) ×4 IMPLANT
GLOVE BIOGEL PI INDICATOR 6.5 (GLOVE) ×1
GLOVE BIOGEL PI INDICATOR 7.0 (GLOVE) ×2
GOWN STRL REIN XL XLG (GOWN DISPOSABLE) ×18 IMPLANT
GYRUS RUMI II 2.5CM BLUE (DISPOSABLE)
GYRUS RUMI II 3.5CM BLUE (DISPOSABLE)
GYRUS RUMI II 4.0CM BLUE (DISPOSABLE)
KIT ACCESSORY DA VINCI DISP (KITS) ×1
KIT ACCESSORY DVNC DISP (KITS) ×2 IMPLANT
LEGGING LITHOTOMY PAIR STRL (DRAPES) ×3 IMPLANT
NEEDLE INSUFFLATION 120MM (ENDOMECHANICALS) ×3 IMPLANT
OCCLUDER COLPOPNEUMO (BALLOONS) ×1 IMPLANT
PACK LAVH (CUSTOM PROCEDURE TRAY) ×3 IMPLANT
PAD PREP 24X48 CUFFED NSTRL (MISCELLANEOUS) ×6 IMPLANT
PLUG CATH AND CAP STER (CATHETERS) ×3 IMPLANT
PROTECTOR NERVE ULNAR (MISCELLANEOUS) ×6 IMPLANT
RUMI II 3.0CM BLUE KOH-EFFICIE (DISPOSABLE) IMPLANT
RUMI II GYRUS 2.5CM BLUE (DISPOSABLE) IMPLANT
RUMI II GYRUS 3.5CM BLUE (DISPOSABLE) IMPLANT
RUMI II GYRUS 4.0CM BLUE (DISPOSABLE) IMPLANT
SET CYSTO W/LG BORE CLAMP LF (SET/KITS/TRAYS/PACK) ×3 IMPLANT
SET IRRIG TUBING LAPAROSCOPIC (IRRIGATION / IRRIGATOR) ×3 IMPLANT
SOLUTION ELECTROLUBE (MISCELLANEOUS) ×3 IMPLANT
STRIP CLOSURE SKIN 1/4X4 (GAUZE/BANDAGES/DRESSINGS) ×3 IMPLANT
SUT VIC AB 0 CT1 27 (SUTURE) ×3
SUT VIC AB 0 CT1 27XBRD ANBCTR (SUTURE) ×2 IMPLANT
SUT VIC AB 0 CT1 27XBRD ANTBC (SUTURE) IMPLANT
SUT VICRYL 0 UR6 27IN ABS (SUTURE) ×3 IMPLANT
SUT VICRYL RAPIDE 4/0 PS 2 (SUTURE) ×6 IMPLANT
SUT VLOC 180 0 9IN  GS21 (SUTURE)
SUT VLOC 180 0 9IN GS21 (SUTURE) IMPLANT
SYR 30ML LL (SYRINGE) ×3 IMPLANT
SYR 50ML LL SCALE MARK (SYRINGE) ×3 IMPLANT
SYRINGE 10CC LL (SYRINGE) ×3 IMPLANT
SYSTEM CONVERTIBLE TROCAR (TROCAR) ×3 IMPLANT
TIP RUMI ORANGE 6.7MMX12CM (TIP) IMPLANT
TIP UTERINE 5.1X6CM LAV DISP (MISCELLANEOUS) IMPLANT
TIP UTERINE 6.7X10CM GRN DISP (MISCELLANEOUS) IMPLANT
TIP UTERINE 6.7X6CM WHT DISP (MISCELLANEOUS) IMPLANT
TIP UTERINE 6.7X8CM BLUE DISP (MISCELLANEOUS) IMPLANT
TOWEL OR 17X24 6PK STRL BLUE (TOWEL DISPOSABLE) ×9 IMPLANT
TRAY FOLEY BAG SILVER LF 14FR (CATHETERS) ×3 IMPLANT
TROCAR 12M 150ML BLUNT (TROCAR) ×3 IMPLANT
TROCAR DILATING TIP 12MM 150MM (ENDOMECHANICALS) ×3 IMPLANT
TROCAR DISP BLADELESS 8 DVNC (TROCAR) ×2 IMPLANT
TROCAR DISP BLADELESS 8MM (TROCAR) ×1
TROCAR XCEL 12X100 BLDLESS (ENDOMECHANICALS) IMPLANT
TROCAR XCEL NON-BLD 5MMX100MML (ENDOMECHANICALS) ×3 IMPLANT
TUBING FILTER THERMOFLATOR (ELECTROSURGICAL) ×3 IMPLANT
WARMER LAPAROSCOPE (MISCELLANEOUS) ×3 IMPLANT
WATER STERILE IRR 1000ML POUR (IV SOLUTION) ×9 IMPLANT

## 2012-10-08 NOTE — Progress Notes (Signed)
Dr Jean Rosenthal in to evaluate patients arm. Pt has normal sensation in her arm and hand. Able to move extremity, but causes discomfort. Site remains localized and has not changed in size or appearance.

## 2012-10-08 NOTE — H&P (View-Only) (Signed)
47 y.o.SingleAfrican American [phone] female here for consideration for robotically assisted TLH  with Left salpingectomy and without removal of ovaries.    She complains of dysmenorrhea, uterine leiomyomata/fibroids, menorrhagia and anemia. PUS on November 17, 2011 showed the uterus to be enlarged and contain 3 fibroids, the largest being about 6 cm.  Adnexa Normal.  Endometrial biopsy on November 24, 2011 was degenerating secretroy endometrium.        The current method of family planning is tubal ligation.     Hormones:Progesterone: Norethindrone 5 mg BID   reports that she has never smoked. She has never used smokeless tobacco. She reports that she does not drink alcohol or use illicit drugs.  @CHLEC1NM @  Health Maintenance  Topic Date Due  . Pap Smear  09/26/1982  . Tetanus/tdap  09/26/1983  . Influenza Vaccine  11/04/2012    History reviewed. No pertinent family history.  Patient Active Problem List   Diagnosis Date Noted  . Anemia, iron deficiency 07/01/2012  . Fibroid uterus 07/01/2012    History reviewed. No pertinent past medical history.  Past Surgical History  Procedure Laterality Date  . Breast lumpectomy      bilateral  . Cesarean section      x 3  . Appendectomy      Allergies: @RRALLERGIES @  Current Outpatient Prescriptions  Medication Sig Dispense Refill  . ibuprofen (ADVIL,MOTRIN) 200 MG tablet Take 400 mg by mouth every 6 (six) hours as needed.      . IRON PO Take by mouth.      . norethindrone (AYGESTIN) 5 MG tablet Take TID until bleeding stops, then take BID x 1 week, then take QD.  50 tablet  0  . celecoxib (CELEBREX) 200 MG capsule Take 1 capsule (200 mg total) by mouth 2 (two) times daily.  3 capsule  3   No current facility-administered medications for this visit.      Exam:    BP 106/62  Pulse 64  Temp(Src) 98.5 F (36.9 C) (Oral)  Ht 5\' 2"  (1.575 m)  Wt 133 lb (60.328 kg)  BMI 24.32 kg/m2  LMP 08/25/2012  General appearance:  alert, cooperative and appears stated age Head: Normocephalic, without obvious abnormality, atraumatic Neck: no adenopathy, no carotid bruit, no JVD, supple, symmetrical, trachea midline and thyroid not enlarged, symmetric, no tenderness/mass/nodules Lungs: clear to auscultation bilaterally Heart: regular rate and rhythm, S1, S2 normal, no murmur, click, rub or gallop Abdomen: soft, non-tender; bowel sounds normal; no masses,  no organomegaly Extremities: extremities normal, atraumatic, no cyanosis or edema Lymph nodes: Cervical, supraclavicular, and axillary nodes normal. no inguinal nodes palpated Neurologic: Grossly normal   Pelvic: External genitalia:  no lesions              Bartholins and Skenes: normal                 Vagina: normal appearing vagina with normal color and discharge, no lesions              Cervix: normal appearance                     Bimanual Exam:  Uterus:  enlarged to 12 week's size, irregular                                      Adnexa:    Not palpable  Rectovaginal: Deferred                                      Anus:  defer exam   A/P:  The planned procedure was discussed with the patient.  Pre-op instructions, hospital course, and post-op instructions reviewed.  Post op instruction booklet given and reviewed.  Risks and possible complications discussed, including but not limited to, bleeding and possible transfusion; infection; anesthesia complications; injury to visceral organ requiring further surgery, either immediately or delayed; wound complications including infection, blood collections, and bowel herniation; allergic reactions; swelling of the face due to prolonged positioning;  VTE or air emboli; nerve injury related to prolonged positioning; even death.   We reviewed the latest guidelines regarding uterine morcellation for fibroids, we discussed that we may need to morcellate the fibroids to remove the uterus with a  morcellator, we discussed the risk that a fibroid can be a leiomyosarcoma and that the act of using a morcellator may result in aeration and spread of unknown sarcomatous cells and spread a potentially deadly disease, if she refuses morcellation and we are unable to morcellate the fibroids successfully vaginally we would need to make an abdominal wall incision.  Questions regarding this were addressed and pt expresses understanding  The patient watched a robotic hysterectomy on youtube prior to her office visit today.  Her questions were invited and answered.  The patient states she understands the risks and possible complications, and wishes to proceed as planned.  Consent form signed.  She was instructed to take two 200 mg celebrex capsules with a small sip of water the morning of the procedure, and then to take one bid on POD 1 and 2.  Rx given for Percocet 5/325 mg #15 to take one or two po q4-6 hours prn post op pain.    Ready for surgery.

## 2012-10-08 NOTE — Anesthesia Postprocedure Evaluation (Signed)
Anesthesia Post Note  Patient: Samantha West  Procedure(s) Performed: Procedure(s) (LRB): ROBOTIC ASSISTED TOTAL HYSTERECTOMY WITH BILATERAL SALPINGECTOMY (N/A) CYSTOSCOPY (N/A)  Anesthesia type: General  Patient location: [facility] Unit  Post pain: Pain level controlled  Post assessment: Post-op Vital signs reviewed  Last Vitals:  Filed Vitals:   10/08/12 1812  BP: 141/77  Pulse: 85  Temp: 37 C  Resp: 16    Post vital signs: Reviewed  Level of consciousness: sedated  Complications:Pt has had swelling in Rt.forearm and numbness in Rt thumb since in PACU.  Swelling has diminished , but numbness remains in thumb. Pt states she has experienced this feeling at home, after awakening from sleep; thinks it is position related. May need to follow up with her Dr for diagnosis.

## 2012-10-08 NOTE — Brief Op Note (Signed)
10/08/2012  12:57 PM  PATIENT:  Samantha West  47 y.o. female  PRE-OPERATIVE DIAGNOSIS:  fibroids, anemia  POST-OPERATIVE DIAGNOSIS:  fibroids, anemia  PROCEDURE:  Procedure(s): ROBOTIC ASSISTED TOTAL HYSTERECTOMY WITH BILATERAL SALPINGECTOMY (N/A) CYSTOSCOPY (N/A) lysis of adhesions  SURGEON:  Surgeon(s) and Role:    * Bennye Alm, MD - Primary    * Annamaria Boots, MD - Assisting  PHYSICIAN ASSISTANT:   ASSISTANTS: none   ANESTHESIA:   general  EBL:  Total I/O In: 1000 [I.V.:1000] Out: 250 [Urine:100; Blood:150]  BLOOD ADMINISTERED:none  DRAINS: none   LOCAL MEDICATIONS USED:  Amount: 60 ml and dilute ropivicaine  SPECIMEN:  Source of Specimen:  uterus, tubes  DISPOSITION OF SPECIMEN:  PATHOLOGY  COUNTS:  YES  TOURNIQUET:  * No tourniquets in log *  DICTATION: .Other Dictation: Dictation Number note in epic  PLAN OF CARE: Admit for overnight observation  PATIENT DISPOSITION:  PACU - hemodynamically stable.   Delay start of Pharmacological VTE agent (>24hrs) due to surgical blood loss or risk of bleeding: not applicable

## 2012-10-08 NOTE — Interval H&P Note (Signed)
History and Physical Interval Note:  10/08/2012 7:19 AM  Samantha West  has presented today for surgery, with the diagnosis of fibroids, anemia  The various methods of treatment have been discussed with the patient and family. After consideration of risks, benefits and other options for treatment, the patient has consented to  Procedure(s): ROBOTIC ASSISTED TOTAL HYSTERECTOMY (N/A) CYSTOSCOPY (N/A) as a surgical intervention .  The patient's history has been reviewed, patient examined, no change in status, stable for surgery.  I have reviewed the patient's chart and labs.  Questions were answered to the patient's satisfaction.     Samantha West H

## 2012-10-08 NOTE — Progress Notes (Signed)
Day of Surgery Procedure(s) (LRB): ROBOTIC ASSISTED TOTAL HYSTERECTOMY WITH BILATERAL SALPINGECTOMY (N/A) CYSTOSCOPY (N/A)  Subjective: Patient denies nausea, vomiting and incisional pain.  Pt reports pain in right arm improving and mobility of fingers better  Objective: I have reviewed patient's vital signs, intake and output and medications.  General: alert, cooperative and appears stated age Resp: clear to auscultation bilaterally Cardio: regular rate and rhythm, S1, S2 normal, no murmur, click, rub or gallop Extremities: extremities normal, atraumatic, no cyanosis or edema Vaginal Bleeding: none Right hand grip normal Assessment: s/p Procedure(s): ROBOTIC ASSISTED TOTAL HYSTERECTOMY WITH BILATERAL SALPINGECTOMY (N/A) CYSTOSCOPY (N/A): stable  Plan: Advance diet if tolerates, will lock ivf Operative findings and photos reviewed  LOS: 0 days    Samantha West H 10/08/2012, 6:26 PM

## 2012-10-08 NOTE — Anesthesia Preprocedure Evaluation (Signed)
Anesthesia Evaluation  Patient identified by MRN, date of birth, ID band Patient awake    Reviewed: Allergy & Precautions, H&P , NPO status , Patient's Chart, lab work & pertinent test results  Airway Mallampati: II TM Distance: >3 FB Neck ROM: full    Dental no notable dental hx. (+) Teeth Intact   Pulmonary neg pulmonary ROS,          Cardiovascular negative cardio ROS      Neuro/Psych negative neurological ROS  negative psych ROS   GI/Hepatic negative GI ROS, Neg liver ROS,   Endo/Other  negative endocrine ROS  Renal/GU negative Renal ROS  negative genitourinary   Musculoskeletal negative musculoskeletal ROS (+)   Abdominal Normal abdominal exam  (+)   Peds  Hematology negative hematology ROS (+)   Anesthesia Other Findings   Reproductive/Obstetrics negative OB ROS                           Anesthesia Physical Anesthesia Plan  ASA: II  Anesthesia Plan: General   Post-op Pain Management:    Induction: Intravenous  Airway Management Planned: Oral ETT  Additional Equipment:   Intra-op Plan:   Post-operative Plan: Extubation in OR  Informed Consent: I have reviewed the patients History and Physical, chart, labs and discussed the procedure including the risks, benefits and alternatives for the proposed anesthesia with the patient or authorized representative who has indicated his/her understanding and acceptance.   Dental Advisory Given  Plan Discussed with: CRNA, Surgeon and Anesthesiologist  Anesthesia Plan Comments:         Anesthesia Quick Evaluation

## 2012-10-08 NOTE — Progress Notes (Signed)
*  PRELIMINARY RESULTS* Vascular Ultrasound Right upper extremity venous duplex has been completed.  Preliminary findings: no evidence of deep or superficial thrombosis of the right upper extremity.   No hematoma noted at area of swelling - proximal forearm.      Farrel Demark, RDMS, RVT  10/08/2012, 3:23 PM

## 2012-10-08 NOTE — Preoperative (Signed)
Beta Blockers   Reason not to administer Beta Blockers:Not Applicable 

## 2012-10-08 NOTE — Transfer of Care (Signed)
Immediate Anesthesia Transfer of Care Note  Patient: Samantha West  Procedure(s) Performed: Procedure(s): ROBOTIC ASSISTED TOTAL HYSTERECTOMY WITH BILATERAL SALPINGECTOMY (N/A) CYSTOSCOPY (N/A)  Patient Location: PACU  Anesthesia Type:General  Level of Consciousness: awake, alert  and oriented  Airway & Oxygen Therapy: Patient Spontanous Breathing and Patient connected to nasal cannula oxygen  Post-op Assessment: Report given to PACU RN, Post -op Vital signs reviewed and stable and Patient moving all extremities X 4  Post vital signs: Reviewed and stable  Complications: No apparent anesthesia complications

## 2012-10-08 NOTE — Anesthesia Postprocedure Evaluation (Signed)
  Anesthesia Post-op Note  Patient: Samantha West  Procedure(s) Performed: Procedure(s): ROBOTIC ASSISTED TOTAL HYSTERECTOMY WITH BILATERAL SALPINGECTOMY (N/A) CYSTOSCOPY (N/A) Patient is awake and responsive. Pain and nausea are reasonably well controlled. Vital signs are stable and clinically acceptable. Oxygen saturation is clinically acceptable. There are no apparent anesthetic complications at this time, Right arm issue will be followed by Dr lathrop. Patient is ready for discharge.

## 2012-10-08 NOTE — Progress Notes (Signed)
Pt admitted to PACU. On initial assessment patients right arm noted to have approximately 2"x3" edematous firm area directly distal to her elbow. The top portion is erythematous. The patient is complaining of discomfort at the site which radiates down her lower arm. Brachial and radial pulses +. Rt arm and hand are warm to touch. Hand grasp weaker on rt side, but appears to be because is causes discomfort. Dr Farrel Gobble made aware. Dr Jean Rosenthal called to come assess patient.  Ice peripad applied per request Dr Farrel Gobble and extremity elevated for patient comfort.

## 2012-10-09 ENCOUNTER — Encounter (HOSPITAL_COMMUNITY): Payer: Self-pay | Admitting: Gynecology

## 2012-10-09 LAB — CBC
Hemoglobin: 11.6 g/dL — ABNORMAL LOW (ref 12.0–15.0)
MCH: 28 pg (ref 26.0–34.0)
Platelets: 230 10*3/uL (ref 150–400)
RBC: 4.15 MIL/uL (ref 3.87–5.11)
WBC: 9.7 10*3/uL (ref 4.0–10.5)

## 2012-10-09 MED ORDER — SIMETHICONE 80 MG PO CHEW: 160.0000 mg | CHEWABLE_TABLET | Freq: Four times a day (QID) | ORAL | Status: DC

## 2012-10-09 MED ORDER — CELECOXIB 100 MG PO CAPS: 100.0000 mg | ORAL_CAPSULE | Freq: Two times a day (BID) | ORAL | Status: DC

## 2012-10-09 MED ORDER — DSS 100 MG PO CAPS: 100.0000 mg | ORAL_CAPSULE | Freq: Two times a day (BID) | ORAL | Status: DC

## 2012-10-09 NOTE — Progress Notes (Signed)
Pt complains of soreness to right post forarm and above the elbow, arm appears tight and swollen, small blister has appeared on post forearm.  Measurement of forarm circumfrence 26cm, above elbow circ 29.5. Pt states the right hand was swollen yesterday but now appears normal.  Ice pack applied and arm elevated on two pillows. Will cont to assess. (dopplered brachial pulse and radial pulse, both strong)

## 2012-10-09 NOTE — Anesthesia Postprocedure Evaluation (Signed)
  Anesthesia Post-op Note  Anesthesia Post Note  Patient: Samantha West  Procedure(s) Performed: Procedure(s) (LRB): ROBOTIC ASSISTED TOTAL HYSTERECTOMY WITH BILATERAL SALPINGECTOMY (N/A) CYSTOSCOPY (N/A)  Anesthesia type: General  Patient location: [facility]  Post pain: Pain level controlled  Post assessment: Post-op Vital signs reviewed  Last Vitals:  Filed Vitals:   10/09/12 0534  BP: 134/77  Pulse: 78  Temp: 36.9 C  Resp: 18    Post vital signs: Reviewed  Level of consciousness: sedated  Complications: No apparent anesthesia complications

## 2012-10-09 NOTE — Progress Notes (Signed)
Follow up to R arm swelling from yesterday. Patient feels she is improved, and the localized swelling is now more diffuse , but feels less tight. Neuro-muscular fxn is intact. Distal pulses are strong. There is a .4cm blister over the  Brachioradialis mm in the area of most intense swelling yesterday.  All signs suggest a pressure injury from perhaps the arm strap and the bean-bag compression. I see no other plausible explanation. I expect full resolution . Patient will call if pain, fxn, or swelling get worse. Questions answered.

## 2012-10-09 NOTE — Progress Notes (Signed)
1 Day Post-Op Procedure(s) (LRB): ROBOTIC ASSISTED TOTAL HYSTERECTOMY WITH BILATERAL SALPINGECTOMY (N/A) CYSTOSCOPY (N/A)  Subjective: Patient reports tolerating PO, + flatus and no problems voiding.    Objective: I have reviewed patient's vital signs, intake and output, medications and labs.  General: alert, cooperative and appears stated age Resp: clear to auscultation bilaterally Cardio: regular rate and rhythm, S1, S2 normal, no murmur, click, rub or gallop GI: soft, non-tender; bowel sounds normal; no masses,  no organomegaly Extremities: edema on right upper extremity inspected at 7am and reinspected at 12p, arm swellling decreasing, mobility improving,  and pulses symmetrical at wrist Vaginal Bleeding: none  Assessment: s/p Procedure(s): ROBOTIC ASSISTED TOTAL HYSTERECTOMY WITH BILATERAL SALPINGECTOMY (N/A) CYSTOSCOPY (N/A): stable  Plan: Discharge home  LOS: 1 day    Samantha West 10/09/2012, 12:16 PM

## 2012-10-10 ENCOUNTER — Telehealth: Payer: Self-pay | Admitting: Gynecology

## 2012-10-10 NOTE — Telephone Encounter (Signed)
1.Patient states she is returning Dr. Liliana Cline call and reports, "Arm is doing better since surgery and trauma to arm but swelling has not gone down completely."  2.Was told to take Celebrex 100 mg. Twice a day but was sent home with 200mg . Please advise? [facility] [address]  3. Also, should she continue takingTylenol?

## 2012-10-10 NOTE — Telephone Encounter (Signed)
Left message following up on her arm, asked to call back

## 2012-10-10 NOTE — Telephone Encounter (Signed)
Reviewed with Dr Farrel Gobble by phone.  Instructed patient to continue ice as previously instructed.   Take last sample capsule of 200 mg Celebrex tonight.  (Has had one cap of Celebrex 200 mg this am)  OV tomm 1145 with dr Farrel Gobble.

## 2012-10-10 NOTE — Telephone Encounter (Signed)
Call back to patient, doing very well at home, arm is still swollen. States arm no worse but not really any better. Slight improvement in swelling at elbow but no improvement in forearm. Limits her mobility, cant rotate wrist. Arm pain is now 2-3/10 which is down from 5.  Using ice and elevation. Abdominal incision fine, passing flatus, voiding well, no nausea or vomiting, tolerating diet. HA this am so took Tylenol 500mg  and relieved HA.   Concerned on correct dose of Celebrex.  200 mg QD or BID?    Advised I will contact Dr Farrel Gobble to see about Celebrex and call her back.

## 2012-10-11 ENCOUNTER — Ambulatory Visit (INDEPENDENT_AMBULATORY_CARE_PROVIDER_SITE_OTHER): Payer: BC Managed Care – PPO | Admitting: Gynecology

## 2012-10-11 ENCOUNTER — Other Ambulatory Visit: Payer: Self-pay | Admitting: *Deleted

## 2012-10-11 VITALS — BP 124/82 | HR 78 | Resp 14 | Ht 62.0 in | Wt 137.0 lb

## 2012-10-11 DIAGNOSIS — R609 Edema, unspecified: Secondary | ICD-10-CM

## 2012-10-11 DIAGNOSIS — R600 Localized edema: Secondary | ICD-10-CM

## 2012-10-11 NOTE — Progress Notes (Signed)
Subjective:     Patient ID: Samantha West, female   DOB: [birth year], 47 y.o.   MRN: [id number]  HPI Comments: Pt called as follow up after surgery, message left, pt returned cal and reported that right upper extremity with decreased mobility. Pt states that she was using ice as recommended in the hospital and had the arm elevated at night. Pt denies any vaginal bleeding, abdominal pain, nausea or vomiting.  She had taken 2 celebrex since discharge.    Review of Systems  All other systems reviewed and are negative.       Objective:   Physical Exam  Constitutional: She is oriented to person, place, and time. She appears well-developed and well-nourished.  Cardiovascular:  Pulses:      Radial pulses are 2+ on the right side, and 2+ on the left side.  Symmetrical brachial and ulnar pulses  Abdominal: Soft. She exhibits no distension. There is no tenderness. There is no rebound.  Musculoskeletal:       Right elbow: She exhibits decreased range of motion and swelling. Tenderness found.       Right wrist: She exhibits normal range of motion, no tenderness, no bony tenderness, no swelling, no effusion and no crepitus.       Arms: Neurological: She is alert and oriented to person, place, and time.       Assessment:     Post operative upper extremity edema  Normal duplex of RUE  Measurements uncharged from POD1    Plan:     continue NSADS-samples of celebrex given Keep elevated Ice Referral to PT Keep post-op visit next week Pt instructed to call for any change in color or temperature between 2 UE

## 2012-10-11 NOTE — Telephone Encounter (Signed)
See OV from 10-11-12

## 2012-10-15 ENCOUNTER — Ambulatory Visit (INDEPENDENT_AMBULATORY_CARE_PROVIDER_SITE_OTHER): Payer: BC Managed Care – PPO | Admitting: Gynecology

## 2012-10-15 ENCOUNTER — Telehealth: Payer: Self-pay | Admitting: *Deleted

## 2012-10-15 ENCOUNTER — Telehealth: Payer: Self-pay | Admitting: Gynecology

## 2012-10-15 VITALS — BP 112/64 | HR 72 | Resp 12 | Ht 62.0 in | Wt 148.0 lb

## 2012-10-15 DIAGNOSIS — S4991XD Unspecified injury of right shoulder and upper arm, subsequent encounter: Secondary | ICD-10-CM

## 2012-10-15 DIAGNOSIS — Z9889 Other specified postprocedural states: Secondary | ICD-10-CM

## 2012-10-15 DIAGNOSIS — M25531 Pain in right wrist: Secondary | ICD-10-CM

## 2012-10-15 DIAGNOSIS — Z5189 Encounter for other specified aftercare: Secondary | ICD-10-CM

## 2012-10-15 NOTE — Telephone Encounter (Signed)
Returned call to [place] at [facility]. Victorino Dike unable to talk . Is with a patient.

## 2012-10-15 NOTE — Telephone Encounter (Signed)
Referral appointment obtained at [facility] for patient august 13th @ 8:45am.  Patient aware of appt. Date and time.

## 2012-10-15 NOTE — Progress Notes (Signed)
  Subjective:     Samantha West is a 47 y.o. female who presents to the clinic 1 weeks status post robotic hysterectomy with bilateral tubes for fibroids, pelvic pain and anemia. Eating a regular diet with difficulty. Bowel movements are normal. The patient is not having any pain. Pt referred to PT last week, has not been contacted yet.  Scant vaginal bleeding-reddish brown  The following portions of the patient's history were reviewed and updated as appropriate: allergies, current medications, past family history, past medical history, past social history, past surgical history and problem list.  Review of Systems Pertinent items are noted in HPI.    Objective:    BP 112/64  Pulse 72  Resp 12  Wt 148 lb (67.132 kg)  BMI 27.06 kg/m2 General:  alert, cooperative and appears stated age  Abdomen: soft, bowel sounds active, non-tender  Incision:   healing well, no drainage, no erythema, no hernia, no seroma, no swelling, no dehiscence, incision well approximated     riguht upper extremity- increased mobility but still limited Assessment:    Doing well postoperatively. Operative findings again reviewed. Pathology report discussed.  Referral to PT:  8:45am tomorrow   Plan:    1. Continue any current medications. 2. Wound care discussed. 3. Activity restrictions: no driving, no lifting more than no sex pounds, no overhead lifting, no sports and no sex 4. Anticipated return to work: 6week depending on arm recovery

## 2012-10-15 NOTE — Telephone Encounter (Signed)
Needs to discuss patient PT after surgery. Hyst

## 2012-10-16 ENCOUNTER — Ambulatory Visit: Payer: BC Managed Care – PPO | Attending: Gynecology | Admitting: Physical Therapy

## 2012-10-16 NOTE — Telephone Encounter (Signed)
Patient notified that Dr lathrop agrees with CT scan and will order.  May not get appointment date till tomm.  Patient would like first available but prefers PM since will need driver.

## 2012-10-16 NOTE — Telephone Encounter (Signed)
Patient came in for PT appointment .Was not comfortable with completing PT. Patient request imaging on RT arm due to surgery.  Did not do Evaluation.

## 2012-10-16 NOTE — Telephone Encounter (Signed)
Patient went to appointment today with [facility] with therapist Victorino Dike. Victorino Dike called our office concerning appt. . See note below. Patient request  More studies be done of CT or imaging of right arm before any therapy to be done. Patient has a brother who is doctor at [facility] and has talked with him concerning her arm also. Victorino Dike stated there are Physical Therapist who specialize in manual Lymphatic Drainage at [facility] and [facility] through [place].

## 2012-10-16 NOTE — Telephone Encounter (Signed)
I think getting a CT is not unreasonable, can we order with contrast

## 2012-10-16 NOTE — Telephone Encounter (Signed)
Patient called in to find out about the  injury done to arm . Still having trouble with that arm. She can't dress herself or do normal task. Needs to know what the next step is ?

## 2012-10-17 ENCOUNTER — Telehealth: Payer: Self-pay | Admitting: *Deleted

## 2012-10-17 NOTE — Telephone Encounter (Signed)
Call to patient to notify that CT of right arm is scheduled for tomorrow 10-18-12 at 145 at [address] location. LMTCB.

## 2012-10-18 ENCOUNTER — Telehealth: Payer: Self-pay | Admitting: Gynecology

## 2012-10-18 ENCOUNTER — Ambulatory Visit
Admission: RE | Admit: 2012-10-18 | Discharge: 2012-10-18 | Disposition: A | Discharge status: Home / Self Care | Payer: BC Managed Care – PPO | Source: Ambulatory Visit | Attending: Gynecology | Admitting: Gynecology

## 2012-10-18 DIAGNOSIS — M25531 Pain in right wrist: Secondary | ICD-10-CM

## 2012-10-18 NOTE — Telephone Encounter (Signed)
Left message on Dr. Farrel Gobble cell # of need to call office concerning patient request for pain and infammation of her right arm.

## 2012-10-18 NOTE — Telephone Encounter (Signed)
Patient is calling to see if you can call her in something for the pain and inflammation due the injury to her RT. FA .   Is there any thing different that may be RX'ed for that pain and inflammation . Or can you you suggest something to resolve this issue?

## 2012-10-18 NOTE — Telephone Encounter (Signed)
Patient requesting for pain medication and inflamation of right forearm.  Patient stating her fingers are swollen now along with right arm.  Patient has been using ice to the areas . States she has no Rx medications for this and it is the week end and does not want to have to go to E.R.

## 2012-10-18 NOTE — Telephone Encounter (Signed)
Called patient and given instructions as per Dr Farrel Gobble.  Per Dr Farrel Gobble, CT shows edema and fluid collection but no sign of any other injury.  Explained Celebrex is expensive and often requires prior auth so Dr Farrel Gobble has ordered Cataflam TID prn.  Advised of recommendation to change to heat to promote healing, patient states she is already doing this.  Also recommend to resume PT treatment. Would do MRI in 4-6 weeks if no improvement.  Patient states that this is her dominant hand and she feels evaluation by orthopedic specialist is indicated and she has checked with her insurance who says she can go there.  She states Dr Farrel Gobble is GYN and she does not want to wait 4-6 weeks without improvement and then have MRI.  She "cant afford to drag this out any longer".  Advised would not be able to contact anyone from ortho offices this PM but can notify Dr Farrel Gobble and  Call for referral on Monday.  Patient asking who we would send her to see. Advised I need dr Farrel Gobble but that io know she haas used [facility] before and they are near our office.  Patient is interested in this option as she has heard of this practice.  Advised ortho appts can take a while to get so we may see who we can get her in with in timely manner. [facility] Instructed to call next week if this medication is not helping as much as Celebrex and will attempt prior auth.

## 2012-10-18 NOTE — Telephone Encounter (Signed)
Phone report to Dr Farrel Gobble. CT results reviewed by Dr Farrel Gobble.  Due to requirement for prior auth by most insurance companies for Celebrex and inability to obtain that on Fri pm, will change to Cataflam 50 mg #30 thirty, 1 po TID with no refills [facility][phone]. Patient to try heat to arm instead of ice and resume PT next week.  MRI in 4-6 weeks if no improvement.

## 2012-10-20 NOTE — Op Note (Signed)
OPERATIVE REPORT   PREOPERATIVE DIAGNOSES: 1. Dysfunctional bleeding 2. Fibroid uterus. 3. Anemia   POSTOPERATIVE DIAGNOSES: 1. Dysfunctional bleeding 2. Fibroid uterus. 3. Anemia  PROCEDURE:  Robotically assisted total hysterectomy, bilateral salpingectomy, lysis of adhesions cystoscopy.  SURGEON:  Ivor Costa. Farrel Gobble, M.D.  ASSISTANT:  Lum Keas, M.D.  ANESTHESIA:  General.  IV FLUIDS:  1000 mL lactated Ringer's.  URINE OUTPUT:  250 mL.  ESTIMATED BLOOD LOSS:  150 mL.  INDICATIONS:  The patient is a 47 year old perimenopausal woman who was with a history of menorrhagia and anemia that responded to aygestin therapy but pt was unable to be maintained on suppression, known multifiboid uterus with 3cm submucosal fiboid noted.  The patient now presents for definitive management.  FINDINGS:  Grossly enlarged uterus with multiple fibroids, large posterior fibroid approximately 8cm, thin filmy adhesions of the left adnexa to uterus and side wall, filmy adhesions noted in the upper abdomen There was no free fluid.  Ovaries were grossly normal  COMPLICATIONS:  None.  PATHOLOGY:  Uterus, tubes  PROCEDURE IN DETAIL:  The patient was taken to the operating room, placed in the dorsal lithotomy position.  Prepped and draped in the usual sterile fashion for a robotic procedure.  The patient's arms were tucked.  The hands were noted to be free, deflection on the legs was appreciated to minimize any nerve traction.  The patient was prepped and draped in the usual sterile fashion.  A bivalve speculum was placed in the vagina.  The cervix was visualized, stabilized with a single-tooth tenaculum.  An anterior and posterior suture of 0 Vicryl were then placed for cervical manipulation.  The cervix was measured  2.5. The uterus had been sounded to 7 and #8 balloon was used. A small cuff KOH ring was then advanced onto the RUMI, and the stay sutures were advanced through the KOH ring  and the KOH ring was then placed snuggly against the cervix.   The uterus was deflected anteriorly to assess the placement of the camera port .  Although the uterus was felt to be larger than 8cm it was felt that the submucosal fibroid was inhibiting the placement of the larger Rumi.  The supraumbilical incision was then made with the scalpel, and the pelvis was entered directly with the aid of an Optiview.  Once placement was confirmed, a pneumoperitoneum was created, the pelvis was inspected and the uterine manipulation was felt to be adequate to operate.  Gloves were then changed.   The patient was paced in Trendelenburg position.  The adnexa was then able to be visualized.  The upper abdomen was examined as mentioned above.  The uterus had a large posterior fibroid that deflected posteriorly, in addition to other subserosal fibroids that were smaller, but  The uterus was able to be freely mobile.  The 8-mm ports were then placed approximately 10 cm from the supraumbilical port under direct visualization with careful attention to transecting vessels.  The 5-mm port was then placed in the midclavicular line.  The robot was then docked.   The bipolar and PK cautery were then advanced through the ports and into the pelvis.    The pelvis was anesthetized with 60 mL of a dilute ropivacaine solution.  The right adnexa was then elevated.  The ureter was noted to be inferior to the operative field, and peristalsis was appreciated.  The tube was then grasped, pulled medially and was treated with PK cautery and then dissected off the underlying ovary,  with the dissection carried through until we were close to the uterus. The round ligament was then treated with cautery and similarly sharply dissected.  The posterior leaf of the broad ligament was then incised until the 2 incisions had met.  Once the ovary was mobile,  And moved laterally the dissection of the posterior leaf carried through until the  uterine vessels were identified, they were later skeletonized. The anterior leaf of the broad ligament was then incised sharply. The bladder flap was taken down judiciously as the pt had had three c-sections.  The bladder was sharply taken down hugging the uterus, the attention to bleeders.  The dissection was carried through to the midline. Attention was then turned to the uterine vessels which were further skeletonized, treated with PK cauter but not transected.   The uterus was then deflected to the left , there were filmy adhesions of the adnexa to the uterus and side wall, there were sharply taken down in order to gain access to the pelvis.  Once this was accomplished, the ureter was identified, the tube was elevated, pulled medially and transected off the ovary in a similar fashion. The round ligament was grasped, transected and the anterior leaf of the broad ligament and bladder flap was created.  The dissection was carried through to the right side and then the bladder was dissected down until the white endopelvic fascia was seen.  The KOH ring was palpated and the anterior leaf was dissected down to approximate the location of the KOH ring.  The uterine vessels were then cauterized again and transected, hemostatis of the pedicles was noted. The posterior leaf of the broad ligament was sharply dissected down again towards the area of the KOH ring.  Attention was then turned back to the area of the KOH ring and an anterior colpotomy was then performed, moving laterally from uterine to uterine with the cautery.  The uterus was then deflected towards the left, and the area between the uterine vessels was taken down again with cautery and carried through the uterosacral ligaments.  In a similar fashion, the anterior colpotomy was carried through on the left-hand side through the uterine vessels, and at this point, the posterior colpotomy was able to be made.  The posterior fibroid was felt to be  limited the ability of the specimen to be delivered through the vagina and decision was made to morcellate the uterus from below.  The patient's legs were elevated, a weighted speculum was placed the cervix was grapsed with a Jacob's tenaculum and the specimen was reduced in size until it could then be delivered. The balloon was re-inflated in hte vagina and a pneumoperitoneum was recreated.  The cuff was then closed in a running fashion with a V-Loc suture moving from the right apex towards the left and brought back for 2 sutures to secure the suture line.  Hemostasis of the cuff was appreciated.  The suture was then cuffed and placed in the lateral side wall to be removed later during the case.  The pelvis was then irrigated with copious amounts of warm saline.  There was no bleeding that was noted from the cuff or the bladder or from any of the operative pedicles.  Peristalsis of both ureters was again noted incidentally during this procedure.  The robot was then undocked.  The suture was removed from the pelvic sidewall.  The ports were then removed under direct visualization.  The pneumoperitoneum was released through the infraumbilical port with manipulation  of the trocar.  The fascia on the supraumbilical port was then identified, elevated, and plicated with 0 Vicryl.  The skin on all the ports was closed with 4-0 plain.  The patient had been given indigo carmine at the point of the fascial closure of the supraumbilical port.  The cystoscopy was then performed. The cystoscope was advanced through the urethra, which was noted to be unremarkable.  The anterior bubble was visualized.  There was no cautery or suture defects noted in the bladder dome.  The trigone was unremarkable.  Peristalsis of blue dye was seen ejecting from both ureteral orifices.  The bladder was then emptied.  The patient tolerated the procedure well.  Sponge, lap, needle counts correct x2.  She was given Cephotetan  intra-operative and was transferred to the recovery room in stable condition.     Ivor Costa. Farrel Gobble, M.D.

## 2012-10-21 ENCOUNTER — Telehealth: Payer: Self-pay | Admitting: Orthopedic Surgery

## 2012-10-21 NOTE — Telephone Encounter (Signed)
Have been unable to reach pt after several attempts to inform of [facility] appt this afternoon.  Call to [facility] to reschedule appt to next available. Next appt with Dr. Janee Morn is not until 10-30-12 at 10:00, but receptionist will take info back to MD to see if appt slot can be opened up sooner. Other MD in practice who specializes in upper extremities, Dr. Ave Filter, is on vacation today until 10-30-12.

## 2012-10-21 NOTE — Telephone Encounter (Signed)
Spoke with receptionist about referral for pt for right forearm pain and inflammation. Appt available today at 3:15 arriving at 2:45. Notes to be faxed.   Call to pt's home number. LMTCB re: appt with ortho this afternoon.

## 2012-10-22 ENCOUNTER — Telehealth: Payer: Self-pay | Admitting: *Deleted

## 2012-10-22 NOTE — Telephone Encounter (Signed)
Spoke with pt regarding her progress, pt states that mobility is slowly returning but there are still activities that are difficult to do.  Pt missed ortho appt yesterday due to issues with phone.  I was able to make an appt for her tomorrow 8/20 at 12:45 with Dr Vira Blanco at [facility] she is pleased, she will do PT there if they feel it is indicated.  We will hold off completing her disability paperwork until she is assess and she is agreeable

## 2012-10-22 NOTE — Telephone Encounter (Signed)
Patient calling concerning phone call of yesterday from Amy of appointment that was available yesterday at [facility].  Patient states she was having phone trouble of receiving incoming calls and missed available appt. Yesterday with Dr. Janee Morn. Patient is aware of scheduled appt. With Dr. Janee Morn for 10/30/2012 @ 10:00am but was hoping he had something sooner for her.  Patient advised to call [facility]. To ask receptionist if any cancellation to notify her to be seen sooner. Patient will do this.

## 2012-10-31 ENCOUNTER — Telehealth: Payer: Self-pay | Admitting: *Deleted

## 2012-10-31 NOTE — Telephone Encounter (Signed)
Calling patient to see where she wanted the rest of her Disability forms faxed to. Patient said she would like them faxed to [employer]. Also wanted to know when she needed to return to work per the forms 11/19/12. Unless her orthopedic surgeon thinks otherwise then they will extend her date on her disability forms patient is aware.  Forms faxed.

## 2012-11-07 ENCOUNTER — Ambulatory Visit: Payer: BC Managed Care – PPO | Admitting: Gynecology

## 2012-11-08 ENCOUNTER — Telehealth: Payer: Self-pay | Admitting: Gynecology

## 2012-11-08 ENCOUNTER — Ambulatory Visit (INDEPENDENT_AMBULATORY_CARE_PROVIDER_SITE_OTHER): Payer: BC Managed Care – PPO | Admitting: Gynecology

## 2012-11-08 ENCOUNTER — Encounter: Payer: Self-pay | Admitting: Gynecology

## 2012-11-08 VITALS — BP 108/62 | HR 60 | Resp 16 | Ht 62.0 in | Wt 131.0 lb

## 2012-11-08 DIAGNOSIS — D259 Leiomyoma of uterus, unspecified: Secondary | ICD-10-CM

## 2012-11-08 DIAGNOSIS — Z9889 Other specified postprocedural states: Secondary | ICD-10-CM

## 2012-11-08 NOTE — Telephone Encounter (Signed)
Patient requesting forms be sent re: [employer] forms. Forms need re-sent to another department to: HR fax [phone] attn. Heather.  Kennon Rounds, are you familiar with this? Do we need a medical record request. We also need help locating this form please?

## 2012-11-08 NOTE — Progress Notes (Signed)
Subjective:     Patient ID: NYHLA MOUNTJOY, female   DOB: 07-04-1965, 47 y.o.   MRN: [id number]  HPI Comments: Pt reports that the swelling in right upper extremity has resolved after 2.5w, she is walking, doing some activties of daily living, no vaginal bleeding.  Pt reports some hot flashes-30sec but 6-7x/day, worse at night with night sweats-no sex.  Feeling better, is driving. Reports some back fatigue.    Review of Systems  Genitourinary: Negative for vaginal bleeding, vaginal discharge and vaginal pain.  All other systems reviewed and are negative.       Objective:   Physical Exam  Constitutional: She is oriented to person, place, and time. She appears well-developed and well-nourished.  Abdominal: Soft. She exhibits no distension. There is no tenderness. There is no rebound.  Musculoskeletal: She exhibits no edema and no tenderness.       Arms: Neurological: She is alert and oriented to person, place, and time.   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

## 2012-11-11 ENCOUNTER — Ambulatory Visit: Payer: Self-pay | Admitting: Gynecology

## 2012-11-11 NOTE — Telephone Encounter (Signed)
Please refax these.

## 2012-11-11 NOTE — Telephone Encounter (Signed)
Tried to call patient LM on patient's VM regarding FMLA papers had been faxed.

## 2012-11-11 NOTE — Telephone Encounter (Signed)
FMLA papers faxed to number listed below.

## 2012-11-20 ENCOUNTER — Ambulatory Visit (INDEPENDENT_AMBULATORY_CARE_PROVIDER_SITE_OTHER): Payer: BC Managed Care – PPO | Admitting: Gynecology

## 2012-11-20 VITALS — BP 126/78 | HR 78 | Resp 16 | Ht 62.0 in | Wt 132.0 lb

## 2012-11-20 DIAGNOSIS — M25539 Pain in unspecified wrist: Secondary | ICD-10-CM

## 2012-11-20 DIAGNOSIS — Z9889 Other specified postprocedural states: Secondary | ICD-10-CM

## 2012-11-20 DIAGNOSIS — M25531 Pain in right wrist: Secondary | ICD-10-CM

## 2012-11-20 DIAGNOSIS — D259 Leiomyoma of uterus, unspecified: Secondary | ICD-10-CM

## 2012-11-20 NOTE — Progress Notes (Signed)
Subjective:     Patient ID: Samantha West, female   DOB: [birth year], 47 y.o.   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  Pt feels that the strength in her right upper extremity is back to normal, denies any urinary or bowel issues, no discharge. Pt will some occaisonal night sweats but no hot flashes    Review of Systems  Constitutional: Negative for fatigue.  Gastrointestinal: Negative for constipation.  Genitourinary: Negative for vaginal bleeding, vaginal discharge, vaginal pain and pelvic pain.       Objective:   Physical Exam  Constitutional: She is oriented to person, place, and time. She appears well-developed and well-nourished.  Abdominal: Soft. She exhibits no distension. There is no tenderness.  Genitourinary: Vagina normal. There is no rash or tenderness on the right labia. There is no rash or tenderness on the left labia. Right adnexum displays no mass and no tenderness. No bleeding around the vagina.  Musculoskeletal:       Right elbow: She exhibits no swelling. No tenderness found.  Strength symmetrical between right and left upper extremity, full range of motion  Neurological: She is alert and oriented to person, place, and time.  cuff well healed and supported     Assessment:     6w post-op  Robotic hysterectomy doing well    Plan:     May return to work without restrictions  To rto if night sweats increase, agrees

## 2013-03-05 ENCOUNTER — Encounter: Payer: Self-pay | Admitting: Gynecology

## 2013-03-24 NOTE — Telephone Encounter (Signed)
To provider for signature. Encounter closed.

## 2013-06-09 ENCOUNTER — Telehealth: Payer: Self-pay | Admitting: Gynecology

## 2013-06-09 NOTE — Telephone Encounter (Signed)
Samantha West has some pain on her left side where ovaries are. Thinks she has a ovarian cyst. Has an appt for 06/27/13 for a 37mth follow up.

## 2013-06-09 NOTE — Telephone Encounter (Signed)
Spoke with patient. Patient states that she is having left sided pain 10/10 that woke her up out of her sleep at 6 am today. Had hysterectomy 8/5 with six month follow up scheduled for 4/24. Patient states that she is unable to put any pressure on her left leg due to the pain and that she thinks it may be a cyst since she has had these before. Has been taking ibuprofen to reduce pain which has been helping pain is now a 4/10. Denies bloating, tenderness, fevers, nausea, urinary/bowel symptoms. Encouraged patient that we would like her to come in today for office visit with Dr.Lathrop. Offered 1500 appointment slot but patient declines. Advised we would like to get her in sooner than later to make sure we resolve her symptoms and that they do not get any worse.Requesting early morning appointment tomorrow with Dr.Lathrop. Appointment made for tomorrow at 9:30 am with Dr.Lathrop (time per TL). Instructed to call back if pain worsens or new symptoms arise.Patient agreeable and verbalizes understanding.    Routing to provider for final review. Patient agreeable to disposition. Will close encounter

## 2013-06-10 ENCOUNTER — Ambulatory Visit (INDEPENDENT_AMBULATORY_CARE_PROVIDER_SITE_OTHER): Payer: BC Managed Care – PPO | Admitting: Gynecology

## 2013-06-10 ENCOUNTER — Encounter: Payer: Self-pay | Admitting: Gynecology

## 2013-06-10 VITALS — BP 118/70 | HR 68 | Temp 98.5°F | Resp 18 | Wt 134.0 lb

## 2013-06-10 DIAGNOSIS — D259 Leiomyoma of uterus, unspecified: Secondary | ICD-10-CM

## 2013-06-10 DIAGNOSIS — R1032 Left lower quadrant pain: Secondary | ICD-10-CM

## 2013-06-10 DIAGNOSIS — K449 Diaphragmatic hernia without obstruction or gangrene: Secondary | ICD-10-CM

## 2013-06-10 DIAGNOSIS — D649 Anemia, unspecified: Secondary | ICD-10-CM

## 2013-06-10 DIAGNOSIS — G4700 Insomnia, unspecified: Secondary | ICD-10-CM

## 2013-06-10 LAB — CBC
HEMATOCRIT: 37.6 % (ref 36.0–46.0)
Hemoglobin: 12.9 g/dL (ref 12.0–15.0)
MCH: 28.5 pg (ref 26.0–34.0)
MCHC: 34.3 g/dL (ref 30.0–36.0)
MCV: 83.2 fL (ref 78.0–100.0)
PLATELETS: 216 10*3/uL (ref 150–400)
RBC: 4.52 MIL/uL (ref 3.87–5.11)
RDW: 14.8 % (ref 11.5–15.5)
WBC: 3.1 10*3/uL — ABNORMAL LOW (ref 4.0–10.5)

## 2013-06-10 MED ORDER — ZOLPIDEM TARTRATE 5 MG PO TABS: 5.0000 mg | ORAL_TABLET | Freq: Every evening | ORAL | Status: DC | PRN

## 2013-06-10 NOTE — Patient Instructions (Signed)
Avoid salads-cru d'ete Try to f/u with GI closer to home [facility] or simethicone-Gas-Ex decaffeinated sweet tea after 2p

## 2013-06-10 NOTE — Progress Notes (Signed)
Subjective:     Patient ID: Samantha West, female   DOB: [birth year], 48 y.o.   MRN: [id number]  HPI Comments: Pt has had a recent change in diet,eating more salads and reports gas pains and diarrhea a few hours afterwards for the past few months-2/week.  Pt is using creamy salad dressings.  No right upper quadrant pain.   Pt reports being diagnosed with hiatal hernia in 1980's and was given dietary modifications. Pt is s/p hysterectomy but has ovaries in place.  No hot flashes or menopausal symptoms.  Pt has had ovarian cyst in past, once with rupture about 25year ago.  Last sex after awokened with pain. Pt also reports issues with sleep-can sleep 4-5h some nights, pt will use tylenol pm, benadryl about at most 2x/w.  Pt will wake up middle of night and cannot fall back to sleep for several hours.  No alcohol, drinks sweet tea all day-64oz, dose not drink decaf sweet tea  Abdominal Pain This is a new problem. The current episode started yesterday. The onset quality is sudden. The most recent episode lasted 1 day. Progression since onset: AWOKE FROM SLEEP. The pain is located in the LLQ and left flank. The pain is at a severity of 4/10 (WAS 10/10 UNTIL 12P). The pain is severe. The quality of the pain is sharp. The abdominal pain radiates to the pelvis and LLQ (LEFT BUTTOCK). Associated symptoms include diarrhea, flatus and vomiting. Pertinent negatives include no anorexia, constipation, fever or nausea. Exacerbated by: MOVEMENT. She has tried acetaminophen (MOTRIN) for the symptoms. The treatment provided moderate relief. Her past medical history is significant for abdominal surgery.     Review of Systems  Constitutional: Negative for fever.  Gastrointestinal: Positive for vomiting, abdominal pain, diarrhea and flatus. Negative for nausea, constipation and anorexia.       Objective:   Physical Exam  Nursing note and vitals reviewed. Constitutional: She appears well-developed and  well-nourished.  Abdominal: Soft. Normal appearance. She exhibits no shifting dullness and no distension. Bowel sounds are decreased. There is no tenderness. There is no rigidity, no rebound, no guarding and no CVA tenderness.  Genitourinary: Vagina normal. There is no tenderness on the right labia. There is no tenderness on the left labia. Right adnexum displays no mass, no tenderness and no fullness. Left adnexum displays tenderness. Left adnexum displays no mass and no fullness.  Lymphadenopathy:       Right: No inguinal adenopathy present.       Left: No inguinal adenopathy present.  vaginal cuff supported, no discharge      Assessment:     Abdominal pain improving  Hiatal hernia insomnia    Plan:     Pt cannot make u/s today, seems to be improving will defer Recommend decreasing salads, has f/u appt in 4/24 will reassess Pt lives closer to [facility], will try to see GI closer to work or home Recommend stopping caffeine by 2p, can make own sweet tea

## 2013-06-27 ENCOUNTER — Ambulatory Visit: Payer: BC Managed Care – PPO | Admitting: Gynecology

## 2013-09-17 ENCOUNTER — Telehealth: Payer: Self-pay | Admitting: Gynecology

## 2013-09-17 NOTE — Telephone Encounter (Signed)
Dr Charlies Constable, Patient called today wanting to schedule her 7mth follow up appt from surgery on 10/08/12. She was seen 06/10/13 for a problem visit and had a 7mth f/u scheduled on 06/27/13 but it was canceled the day of problem visit per you. I scheduled the patient for 10/07/13 at 9:00 am does this need to be a f/u or does it need to be an aex. It doesn't look like she had ever had one here.

## 2013-09-17 NOTE — Telephone Encounter (Signed)
annual

## 2013-10-07 ENCOUNTER — Ambulatory Visit (INDEPENDENT_AMBULATORY_CARE_PROVIDER_SITE_OTHER): Payer: BC Managed Care – PPO | Admitting: Gynecology

## 2013-10-07 ENCOUNTER — Encounter: Payer: Self-pay | Admitting: Gynecology

## 2013-10-07 VITALS — BP 110/60 | HR 70 | Resp 12 | Ht 62.5 in | Wt 132.0 lb

## 2013-10-07 DIAGNOSIS — Z0000 Encounter for general adult medical examination without abnormal findings: Secondary | ICD-10-CM

## 2013-10-07 DIAGNOSIS — Z01419 Encounter for gynecological examination (general) (routine) without abnormal findings: Secondary | ICD-10-CM

## 2013-10-07 DIAGNOSIS — G4700 Insomnia, unspecified: Secondary | ICD-10-CM

## 2013-10-07 LAB — POCT URINALYSIS DIPSTICK
PH UA: 5
Urobilinogen, UA: NEGATIVE

## 2013-10-07 LAB — HEMOGLOBIN, FINGERSTICK: Hemoglobin, fingerstick: 12.9 g/dL (ref 12.0–16.0)

## 2013-10-07 MED ORDER — ZOLPIDEM TARTRATE 5 MG PO TABS: 5.0000 mg | ORAL_TABLET | Freq: Every evening | ORAL | Status: DC | PRN

## 2013-10-07 NOTE — Progress Notes (Signed)
RX for Ambien 5 mg #30/1 refills printed out and [facility] [address].

## 2013-10-07 NOTE — Addendum Note (Signed)
Addended by: Elveria Rising on: 10/07/2013 10:26 AM   Modules accepted: Orders

## 2013-10-07 NOTE — Patient Instructions (Signed)

## 2013-10-07 NOTE — Progress Notes (Addendum)
48 y.o. Divorced [place] American female   [phone] here for annual exam. Pt is currently sexually active. Pt requests refill of ambien, is using about twice a week due to scheduling changes. No dyspareunia, rare hot flash  Patient's last menstrual period was 08/25/2012.          Sexually active: Yes.    The current method of family planning is status post hysterectomy.    Exercising: Yes.    walking qd Last pap:  01/16/12 NEG HR HPV  Alcohol: No Tobacco: no BSE:  no Mammogram:  Never had one   Hgb: 12.9   ; Urine:  Trace Bood , Leuks 1   Health Maintenance  Topic Date Due  . Pap Smear  09/26/1982  . Tetanus/tdap  09/26/1983  . Influenza Vaccine  10/04/2013    No family history on file.  There are no active problems to display for this patient.   No past medical history on file.  Past Surgical History  Procedure Laterality Date  . Breast lumpectomy      bilateral  . Cesarean section      x 3  . Appendectomy    . Robotic assisted total hysterectomy N/A 10/08/2012    Procedure: ROBOTIC ASSISTED TOTAL HYSTERECTOMY WITH BILATERAL SALPINGECTOMY;  Surgeon: Azalia Bilis, MD;  Location: [facility];  Service: Gynecology;  Laterality: N/A;  . Cystoscopy N/A 10/08/2012    Procedure: CYSTOSCOPY;  Surgeon: Azalia Bilis, MD;  Location: [facility];  Service: Gynecology;  Laterality: N/A;    Allergies: Review of patient's allergies indicates no known allergies.  Current Outpatient Prescriptions  Medication Sig Dispense Refill  . IBUPROFEN PO Take by mouth as needed.      . zolpidem (AMBIEN) 5 MG tablet Take 1 tablet (5 mg total) by mouth at bedtime as needed for sleep.  20 tablet  0   No current facility-administered medications for this visit.    ROS: Pertinent items are noted in HPI.  Exam:    Ht 5' 2.5" (1.588 m)  Wt 132 lb (59.875 kg)  BMI 23.74 kg/m2  LMP 08/25/2012 Weight change: @WEIGHTCHANGE @ Last 3 height recordings:  Ht Readings from Last 3 Encounters:  10/07/13 5'  2.5" (1.588 m)  11/20/12 5\' 2"  (1.575 m)  11/08/12 5\' 2"  (1.575 m)   General appearance: alert, cooperative and appears stated age Head: Normocephalic, without obvious abnormality, atraumatic Neck: no adenopathy, no carotid bruit, no JVD, supple, symmetrical, trachea midline and thyroid not enlarged, symmetric, no tenderness/mass/nodules Lungs: clear to auscultation bilaterally Breasts: normal appearance, no masses or tenderness Heart: regular rate and rhythm, S1, S2 normal, no murmur, click, rub or gallop Abdomen: soft, non-tender; bowel sounds normal; no masses,  no organomegaly Extremities: extremities normal, atraumatic, no cyanosis or edema Skin: Skin color, texture, turgor normal. No rashes or lesions Lymph nodes: Cervical, supraclavicular, and axillary nodes normal. no inguinal nodes palpated Neurologic: Grossly normal   Pelvic: External genitalia:  normal escutcheon              Urethra: normal appearing urethra with no masses, tenderness or lesions              Bartholins and Skenes: Bartholin's, Urethra, Skene's normal                 Vagina: normal appearing vagina with normal color and discharge, no lesions              Cervix: absent  Pap taken: No.        Bimanual Exam:  Uterus:  absent                                      Adnexa:    normal adnexa in size, nontender and no masses                                      Rectovaginal: Confirms                                      Anus:  normal sphincter tone, no lesions     1. Routine gynecological examination Mammogram OVERDUE pt works on [place], offered to make appt for her today, declines but aware needs to make pap smear NI counseled on menopause, adequate intake of calcium and vitamin D, diet and exercise return annually or prn   2. Laboratory examination ordered as part of a routine general medical examination  - POCT Urinalysis Dipstick - Hemoglobin, fingerstick  An After Visit Summary was  printed and given to the patient.   Refill ambien 5mg , #30  Refill 1

## 2014-01-05 ENCOUNTER — Encounter: Payer: Self-pay | Admitting: Gynecology

## 2014-10-09 ENCOUNTER — Ambulatory Visit: Payer: BC Managed Care – PPO | Admitting: Gynecology

## 2014-11-12 ENCOUNTER — Encounter: Payer: Self-pay | Admitting: Obstetrics and Gynecology

## 2014-11-12 ENCOUNTER — Ambulatory Visit (INDEPENDENT_AMBULATORY_CARE_PROVIDER_SITE_OTHER): Payer: BLUE CROSS/BLUE SHIELD | Admitting: Obstetrics and Gynecology

## 2014-11-12 VITALS — BP 120/82 | HR 70 | Resp 12 | Ht 62.0 in | Wt 135.2 lb

## 2014-11-12 DIAGNOSIS — Z0000 Encounter for general adult medical examination without abnormal findings: Secondary | ICD-10-CM

## 2014-11-12 DIAGNOSIS — G4700 Insomnia, unspecified: Secondary | ICD-10-CM | POA: Diagnosis not present

## 2014-11-12 DIAGNOSIS — Z01419 Encounter for gynecological examination (general) (routine) without abnormal findings: Secondary | ICD-10-CM | POA: Diagnosis not present

## 2014-11-12 DIAGNOSIS — Z23 Encounter for immunization: Secondary | ICD-10-CM | POA: Diagnosis not present

## 2014-11-12 LAB — POCT URINALYSIS DIPSTICK
Bilirubin, UA: NEGATIVE
Blood, UA: NEGATIVE
GLUCOSE UA: NEGATIVE
Ketones, UA: NEGATIVE
LEUKOCYTES UA: NEGATIVE
NITRITE UA: NEGATIVE
PROTEIN UA: NEGATIVE
UROBILINOGEN UA: NEGATIVE
pH, UA: 5

## 2014-11-12 MED ORDER — ZOLPIDEM TARTRATE 5 MG PO TABS: 5.0000 mg | ORAL_TABLET | Freq: Every evening | ORAL | Status: DC | PRN

## 2014-11-12 NOTE — Progress Notes (Signed)
Patient ID: Samantha West, female   DOB: [birth year], 49 y.o.   MRN: [id number] 49 y.o. [id number] Divorced [place] American female here for annual exam.    Status post robotic laparoscopic hysterectomy with bilateral salpingectomy for fibroids - 10/2012. Still has her ovaries.  Some discomfort after intercourse. No significant hot flashes.  Did have them prior to hysterectomy and right after surgery.  Has some anxious moments.  Increased heart rate, even at rest.   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  3 daughters and 2 grand daughters and one grandson.   Did labs recently for insurance policy.   Using Ambien not often.   Saw GI many years in the past for a hiatal hernia and reflux.   PCP:   None yet.   Patient's last menstrual period was 08/25/2012.          Sexually active: Yes.  female  The current method of family planning is status post hysterectomy.    Exercising: No.   Smoker:  no  Health Maintenance: Pap:  01-16-12 Neg:Neg HR HPV History of abnormal Pap:  Yes, 1980s history of colposcopy and cryotherapy to cervix. Pap smears have been normal since per patient. MMG:  NEVER--Patient will schedule. Colonoscopy:  NEVER--Patient will schedule.  BMD:   n/a  Result  n/a TDaP:  Over 10 years ago.   Screening Labs:  Hb today: Labs drawn for [employer] Urine today: Neg   reports that she has never smoked. She has never used smokeless tobacco. She reports that she does not drink alcohol or use illicit drugs.  Past Medical History  Diagnosis Date  . Migraine   . Anemia 2014    prior to Hysterectomy--because of fibroid  . Fibroid   . Abnormal Pap smear of cervix     -1980s hx of colposcopy and cryotherapy to cervix--paps normal since    Past Surgical History  Procedure Laterality Date  . Breast lumpectomy      bilateral  . Cesarean section      x 3  . Appendectomy    . Robotic assisted total hysterectomy N/A 10/08/2012    Procedure: ROBOTIC ASSISTED TOTAL  HYSTERECTOMY WITH BILATERAL SALPINGECTOMY;  Surgeon: Azalia Bilis, MD;  Location: [facility];  Service: Gynecology;  Laterality: N/A;  . Cystoscopy N/A 10/08/2012    Procedure: CYSTOSCOPY;  Surgeon: Azalia Bilis, MD;  Location: [facility];  Service: Gynecology;  Laterality: N/A;  . Tubal ligation      Current Outpatient Prescriptions  Medication Sig Dispense Refill  . DiphenhydrAMINE HCl (BENADRYL ALLERGY PO) Take by mouth.    . IBUPROFEN PO Take by mouth as needed.    . zolpidem (AMBIEN) 5 MG tablet Take 1 tablet (5 mg total) by mouth at bedtime as needed for sleep. 30 tablet 1   No current facility-administered medications for this visit.    Family History  Problem Relation Age of Onset  . Hypertension Mother   . Heart failure Mother   . Stroke Mother     Dec d/t complications of stroke at age 50  . Diabetes Paternal Grandmother   . Diabetes Sister   . Hypertension Sister     ROS:  Pertinent items are noted in HPI.  Otherwise, a comprehensive ROS was negative.  Exam:   BP 120/82 mmHg  Pulse 70  Resp 12  Ht 5\' 2"  (1.575 m)  Wt 135 lb 3.2 oz (61.326 kg)  BMI 24.72 kg/m2  LMP 08/25/2012  General appearance: alert, cooperative and appears stated age Head: Normocephalic, without obvious abnormality, atraumatic Neck: no adenopathy, supple, symmetrical, trachea midline and thyroid normal to inspection and palpation Lungs: clear to auscultation bilaterally Breasts: normal appearance, no masses or tenderness, Inspection negative, No nipple retraction or dimpling, No nipple discharge or bleeding, No axillary or supraclavicular adenopathy Heart: regular rate and rhythm Abdomen: soft, non-tender; bowel sounds normal; no masses,  no organomegaly Extremities: extremities normal, atraumatic, no cyanosis or edema Skin: Skin color, texture, turgor normal. No rashes or lesions Lymph nodes: Cervical, supraclavicular, and axillary nodes normal. No abnormal inguinal nodes  palpated Neurologic: Grossly normal  Pelvic: External genitalia:  no lesions              Urethra:  normal appearing urethra with no masses, tenderness or lesions              Bartholins and Skenes: normal                 Vagina: normal appearing vagina with normal color and discharge, no lesions              Cervix: absent              Pap taken: Yes.   Bimanual Exam:  Uterus:  uterus absent              Adnexa: normal adnexa and no mass, fullness, tenderness              Rectovaginal: Yes.  .  Confirms.              Anus:  normal sphincter tone, no lesions  Chaperone was present for exam.  Assessment:   Well woman visit with normal exam. Status post robotic hysterectomy with bilateral salpingectomy.  Insomnia.   Plan: Yearly mammogram recommended after age 50.  Gave information to call [facility].  Recommended self breast exam.  Pap and HR HPV as above. Recommendations for Calcium, Vitamin D, regular exercise program including cardiovascular and weight bearing exercise. Labs performed.  No..   See orders. Refills given on medications.  Yes.  .  See orders.  Ambien 5 mg po q hs prn.  #30, RF none.  Recommendation for screening colonoscopy.  Patient will check her provider list and schedule in the [place] area.  Will also establish care with PCP.  TDap vaccine.  Follow up annually and prn.      After visit summary provided.

## 2014-11-12 NOTE — Patient Instructions (Signed)

## 2014-11-16 LAB — IPS PAP TEST WITH HPV

## 2014-11-19 ENCOUNTER — Telehealth: Payer: Self-pay

## 2014-11-19 MED ORDER — METRONIDAZOLE 500 MG PO TABS: 500.0000 mg | ORAL_TABLET | Freq: Two times a day (BID) | ORAL | Status: DC

## 2014-11-19 NOTE — Telephone Encounter (Signed)
-----   Message from Nunzio Cobbs, MD sent at 11/17/2014  8:39 PM EDT ----- Please inform patient of pap and HR HPV which are negative.  She did have signs of bacterial vaginosis, and the patient indicates some discomfort with intercourse. If she is having discharge, odor, or vaginal burning, I recommend treatment with Flagyl 500 mg po bid for 7 days or Metrogel 0.75% pv at hs for 5 nights.  ETOH precautions please.  Please let me know.   Cc- Marisa Sprinkles

## 2014-11-19 NOTE — Telephone Encounter (Signed)
Spoke with patient and reviewed results of pap with her.  She states she is having vaginal odor and would like [facility] [place] G.Flagyl 500mg  #14, 1po bid Escribed to pharmacy. Advised no ETOH while taking Flagyl.

## 2015-05-24 ENCOUNTER — Other Ambulatory Visit: Payer: Self-pay | Admitting: *Deleted

## 2015-05-24 DIAGNOSIS — G4700 Insomnia, unspecified: Secondary | ICD-10-CM

## 2015-05-24 MED ORDER — ZOLPIDEM TARTRATE 5 MG PO TABS: 5.0000 mg | ORAL_TABLET | Freq: Every evening | ORAL | Status: DC | PRN

## 2015-05-24 NOTE — Telephone Encounter (Signed)
Rx printed, signed by Dr. Quincy Simmonds and faxed to [facility], patient is aware.

## 2015-05-24 NOTE — Telephone Encounter (Signed)
[facility] [place]  Medication refill request: Ambien 5 mg  Last AEX:  11/12/14 with BS Next AEX: 11/17/15 with BS  Last MMG (if hormonal medication request): N/A Refill authorized: #30/0 rfs

## 2015-10-14 ENCOUNTER — Other Ambulatory Visit: Payer: Self-pay | Admitting: Obstetrics and Gynecology

## 2015-10-14 DIAGNOSIS — G4700 Insomnia, unspecified: Secondary | ICD-10-CM

## 2015-10-14 MED ORDER — ZOLPIDEM TARTRATE 5 MG PO TABS: 5.0000 mg | ORAL_TABLET | Freq: Every evening | ORAL | 0 refills | Status: DC | PRN

## 2015-10-14 NOTE — Telephone Encounter (Signed)
Patient requesting a refill of generic [facility] [place] at [phone].

## 2015-10-14 NOTE — Telephone Encounter (Signed)
[facility] [place]

## 2015-10-14 NOTE — Telephone Encounter (Signed)
Medication refill request: Zolpidem Last AEX:  11/12/14 BS Next AEX: 11/17/15 BS Last MMG (if hormonal medication request): n/a Refill authorized: 05/24/15 #30 0R. Please advise. Thank you.

## 2015-11-17 ENCOUNTER — Ambulatory Visit (INDEPENDENT_AMBULATORY_CARE_PROVIDER_SITE_OTHER): Payer: BLUE CROSS/BLUE SHIELD | Admitting: Obstetrics and Gynecology

## 2015-11-17 ENCOUNTER — Encounter: Payer: Self-pay | Admitting: Obstetrics and Gynecology

## 2015-11-17 VITALS — BP 120/78 | HR 66 | Resp 14 | Ht 61.75 in | Wt 134.2 lb

## 2015-11-17 DIAGNOSIS — Z0000 Encounter for general adult medical examination without abnormal findings: Secondary | ICD-10-CM

## 2015-11-17 DIAGNOSIS — Z1211 Encounter for screening for malignant neoplasm of colon: Secondary | ICD-10-CM

## 2015-11-17 DIAGNOSIS — Z01419 Encounter for gynecological examination (general) (routine) without abnormal findings: Secondary | ICD-10-CM

## 2015-11-17 LAB — COMPREHENSIVE METABOLIC PANEL
ALK PHOS: 44 U/L (ref 33–130)
ALT: 12 U/L (ref 6–29)
AST: 19 U/L (ref 10–35)
Albumin: 4.1 g/dL (ref 3.6–5.1)
BILIRUBIN TOTAL: 0.7 mg/dL (ref 0.2–1.2)
BUN: 10 mg/dL (ref 7–25)
CO2: 28 mmol/L (ref 20–31)
CREATININE: 1.05 mg/dL (ref 0.50–1.05)
Calcium: 9.4 mg/dL (ref 8.6–10.4)
Chloride: 107 mmol/L (ref 98–110)
GLUCOSE: 81 mg/dL (ref 65–99)
Potassium: 4.3 mmol/L (ref 3.5–5.3)
SODIUM: 141 mmol/L (ref 135–146)
Total Protein: 7.1 g/dL (ref 6.1–8.1)

## 2015-11-17 LAB — CBC
HCT: 40.8 % (ref 35.0–45.0)
Hemoglobin: 13.3 g/dL (ref 11.7–15.5)
MCH: 27.5 pg (ref 27.0–33.0)
MCHC: 32.6 g/dL (ref 32.0–36.0)
MCV: 84.3 fL (ref 80.0–100.0)
MPV: 10.6 fL (ref 7.5–12.5)
PLATELETS: 217 10*3/uL (ref 140–400)
RBC: 4.84 MIL/uL (ref 3.80–5.10)
RDW: 14.8 % (ref 11.0–15.0)
WBC: 3.1 10*3/uL — AB (ref 3.8–10.8)

## 2015-11-17 LAB — POCT URINALYSIS DIPSTICK
Bilirubin, UA: NEGATIVE
GLUCOSE UA: NEGATIVE
KETONES UA: NEGATIVE
LEUKOCYTES UA: NEGATIVE
NITRITE UA: NEGATIVE
Protein, UA: NEGATIVE
RBC UA: NEGATIVE
UROBILINOGEN UA: NEGATIVE
pH, UA: 5

## 2015-11-17 LAB — TSH: TSH: 1.48 mIU/L

## 2015-11-17 LAB — LIPID PANEL
CHOL/HDL RATIO: 4.8 ratio (ref <=5.0)
Cholesterol: 212 mg/dL — ABNORMAL HIGH (ref 125–200)
HDL: 44 mg/dL — ABNORMAL LOW (ref >=46)
LDL Cholesterol: 145 mg/dL — ABNORMAL HIGH (ref <130)
Triglycerides: 117 mg/dL (ref <150)
VLDL: 23 mg/dL (ref <30)

## 2015-11-17 NOTE — Progress Notes (Signed)
50 y.o. [id number] Divorced [place] American female here for annual exam.    Working out more.  Helping stress.   PCP:  None   Patient's last menstrual period was 08/25/2012.           Sexually active: Yes.   female The current method of family planning is status post hysterectomy--ovaries remain.    Exercising: Yes.    Works out at gym Smoker:  no  Health Maintenance: Pap:  11-12-14 Neg:Neg HR HPV History of abnormal Pap:  Yes, Hx cryotherapy to cervix 1980's--paps normal since per patient. MMG:  NEVER--WILL DO THIS YEAR  Colonoscopy:  NEVER BMD:   n/a  Result  n/a TDaP:  11-12-14 Gardasil:   N/A   Hep C:  NA Screening Labs:  Hb today: 13.3, Urine today: Neg   reports that she has never smoked. She has never used smokeless tobacco. She reports that she does not drink alcohol or use drugs.  Past Medical History:  Diagnosis Date  . Abnormal Pap smear of cervix    -1980s hx of colposcopy and cryotherapy to cervix--paps normal since  . Anemia 2014   prior to Hysterectomy--because of fibroid  . Fibroid   . Migraine     Past Surgical History:  Procedure Laterality Date  . APPENDECTOMY    . BREAST LUMPECTOMY     bilateral  . CESAREAN SECTION     x 3  . CYSTOSCOPY N/A 10/08/2012   Procedure: CYSTOSCOPY;  Surgeon: Azalia Bilis, MD;  Location: [facility];  Service: Gynecology;  Laterality: N/A;  . ROBOTIC ASSISTED TOTAL HYSTERECTOMY N/A 10/08/2012   Procedure: ROBOTIC ASSISTED TOTAL HYSTERECTOMY WITH BILATERAL SALPINGECTOMY;  Surgeon: Azalia Bilis, MD;  Location: [facility];  Service: Gynecology;  Laterality: N/A;  . TUBAL LIGATION      Current Outpatient Prescriptions  Medication Sig Dispense Refill  . zolpidem (AMBIEN) 5 MG tablet Take 1 tablet (5 mg total) by mouth at bedtime as needed for sleep. 30 tablet 0   No current facility-administered medications for this visit.     Family History  Problem Relation Age of Onset  . Hypertension Mother   . Heart failure Mother   .  Stroke Mother     Dec d/t complications of stroke at age 31  . Diabetes Sister   . Hypertension Sister   . Diabetes Paternal Grandmother     ROS:  Pertinent items are noted in HPI.  Otherwise, a comprehensive ROS was negative.  Exam:   BP 120/78 (BP Location: Right Arm, Patient Position: Sitting, Cuff Size: Normal)   Pulse 66   Resp 14   Ht 5' 1.75" (1.568 m)   Wt 134 lb 3.2 oz (60.9 kg)   LMP 08/25/2012   BMI 24.74 kg/m     General appearance: alert, cooperative and appears stated age Head: Normocephalic, without obvious abnormality, atraumatic Neck: no adenopathy, supple, symmetrical, trachea midline and thyroid normal to inspection and palpation Lungs: clear to auscultation bilaterally Breasts: normal appearance, no masses or tenderness, No nipple retraction or dimpling, No nipple discharge or bleeding, No axillary or supraclavicular adenopathy Heart: regular rate and rhythm Abdomen: soft, non-tender; no masses, no organomegaly Extremities: extremities normal, atraumatic, no cyanosis or edema Skin: Skin color, texture, turgor normal. No rashes or lesions Lymph nodes: Cervical, supraclavicular, and axillary nodes normal. No abnormal inguinal nodes palpated Neurologic: Grossly normal  Pelvic: External genitalia:  no lesions  Urethra:  normal appearing urethra with no masses, tenderness or lesions              Bartholins and Skenes: normal                 Vagina: normal appearing vagina with normal color and discharge, no lesions              Cervix:  absent              Pap taken: No. Bimanual Exam:  Uterus:  normal size, contour, position, consistency, mobility, non-tender              Adnexa: no mass, fullness, tenderness              Rectal exam: Yes.  .  Confirms.              Anus:  normal sphincter tone, no lesions  Chaperone was present for exam.  Assessment:   Well woman visit with normal exam. Status post robotic hysterectomy with bilateral  salpingectomy.  Ovaries remain.  Remote hx abnormal pap.  No prior mammogram or colonoscopy.  Plan: Yearly mammogram recommended after age 70.  Phone number given for [facility].  Patient will call.  Discussed mammogram guidelines.  Recommended self breast exam.  Pap and HR HPV as above. Discussed Calcium, Vitamin D, regular exercise program including cardiovascular and weight bearing exercise. Referral to GI for colonoscopy. Routine blood work. Follow up annually and prn.      After visit summary provided.

## 2015-11-17 NOTE — Patient Instructions (Signed)

## 2015-11-18 LAB — HEMOGLOBIN, FINGERSTICK: HEMOGLOBIN, FINGERSTICK: 13.3 g/dL (ref 12.0–16.0)

## 2015-11-23 ENCOUNTER — Telehealth: Payer: Self-pay | Admitting: Obstetrics and Gynecology

## 2015-11-23 NOTE — Telephone Encounter (Signed)
  Left message to call [place] at [phone].  Notes Recorded by Lowella Fairy, CMA on 11/22/2015 at 11:28 AM EDT Patient notified of lab results. ------  Notes Recorded by Nunzio Cobbs, MD on 11/18/2015 at 9:38 PM EDT Please report results to patient.   Lipid panel showing slightly elevated LDL cholesterol and low HDL cholesterol.  The ratios overall show some increased risk of cardiovascular disease.  This can be lowered through diet low in saturated fat and cholesterol and increasing aerobic exericse.  I recommend testing this yearly.   Her blood counts showed slightly decreased white blood cells, but this is stable for her from 2 years ago.   Blood chemistries and thyroid testing are normal.

## 2015-11-23 NOTE — Telephone Encounter (Signed)
Patient wants to speak with the nurse about her lab results.

## 2015-11-24 NOTE — Telephone Encounter (Signed)
Returned call to patient. Patient just wanted clarity on her blood work and to know the "numbers." RN provided this information to patient and helped patient think of ways she could lower her cholesterol. Patient states she plans to work on her diet and focus on eating fresh foods versus processed foods and "so much bread and butter." Patient states she also plans to try to incorporate some exercise. Patient states she is about to change her PCP and will follow up with PCP once established. Patient appreciative of phone call.   Routing to provider for final review. Patient agreeable to disposition. Will close encounter.

## 2015-11-24 NOTE — Telephone Encounter (Signed)
Patient is returning a call to Kaitlyn. °

## 2015-12-29 HISTORY — PX: COLONOSCOPY W/ BIOPSIES: SHX1374

## 2015-12-29 LAB — HM COLONOSCOPY

## 2016-01-07 ENCOUNTER — Telehealth: Payer: Self-pay | Admitting: Obstetrics and Gynecology

## 2016-01-07 MED ORDER — ZOLPIDEM TARTRATE 5 MG PO TABS: 5.0000 mg | ORAL_TABLET | Freq: Every evening | ORAL | 0 refills | Status: DC | PRN

## 2016-01-07 NOTE — Telephone Encounter (Signed)
OK for Ambien 5 mg po q hs prn.  #30, RF none.

## 2016-01-07 NOTE — Telephone Encounter (Signed)
Patient called requesting refills on generic Ambien to her pharmacy on file.

## 2016-01-07 NOTE — Telephone Encounter (Signed)
Spoke with patient. Advised prescription faxed to pharmacy on file. Patient verbalizes understanding and is agreeable.   Routing to provider for final review. Patient is agreeable to disposition. Will close encounter.   [phone]

## 2016-01-07 NOTE — Telephone Encounter (Signed)
Med refill request:Ambien 5mg  Last AEX: 11/17/15 Next AEX: 11/17/16 Last MMG (if hormonal med) Refill authorized: Please Advise? Last filled 10/14/15 #30/0RF

## 2016-02-15 ENCOUNTER — Other Ambulatory Visit: Payer: Self-pay | Admitting: Obstetrics and Gynecology

## 2016-02-15 NOTE — Telephone Encounter (Signed)
Medication refill request: Zolpidem Last AEX:  11/17/15 BS Next AEX: 11/17/16 BS Last MMG (if hormonal medication request): n/a Refill authorized: 01/07/16 #30 0R. Please advise. Thank you.

## 2016-02-16 NOTE — Telephone Encounter (Signed)
Prescription faxed to pharmacy on file. -[place]

## 2016-05-10 ENCOUNTER — Other Ambulatory Visit: Payer: Self-pay | Admitting: Obstetrics and Gynecology

## 2016-05-10 NOTE — Telephone Encounter (Signed)
Medication refill request: Zolpidem Last AEX:  11/17/15 BS Next AEX: 11/17/16 BS Last MMG (if hormonal medication request): n/a Refill authorized: 02/16/16 #30 0R. Please advise. Thank you.

## 2016-05-11 NOTE — Telephone Encounter (Signed)
[facility] [place] [facility]

## 2016-06-19 ENCOUNTER — Other Ambulatory Visit: Payer: Self-pay | Admitting: Obstetrics and Gynecology

## 2016-06-19 NOTE — Telephone Encounter (Signed)
Medication refill request: Ambien   Last AEX:  11-17-15  Next AEX: 11-17-16  Last MMG (if hormonal medication request): none on file  Refill authorized: please advise

## 2016-06-19 NOTE — Telephone Encounter (Signed)
Faxed Rx for Ambien 5mg  #30, zero refills to  [facility] Fax [phone].

## 2016-07-26 ENCOUNTER — Other Ambulatory Visit: Payer: Self-pay | Admitting: Obstetrics and Gynecology

## 2016-07-26 NOTE — Telephone Encounter (Signed)
Medication refill request: Ambien  Last AEX:  11-17-15  Next AEX: 11-17-16  Last MMG (if hormonal medication request): none on file  Refill authorized: please advise

## 2016-08-31 ENCOUNTER — Other Ambulatory Visit: Payer: Self-pay | Admitting: Obstetrics and Gynecology

## 2016-08-31 NOTE — Telephone Encounter (Signed)
Signed RX faxed to [facility].

## 2016-08-31 NOTE — Telephone Encounter (Signed)
eScribe request from [facility] for refill on ZOLPIDEM Last filled - 07/26/16, #30 with no refills Last AEX - 11/17/15 Next AEX - 11/17/16 Last MMG (if hormonal medication request) - N/A Refill Authorized - please advise

## 2016-09-11 ENCOUNTER — Telehealth: Payer: Self-pay | Admitting: Obstetrics and Gynecology

## 2016-09-11 NOTE — Telephone Encounter (Signed)
LMTCB about canceled appt/rd

## 2016-09-12 NOTE — Telephone Encounter (Signed)
Thank you for the update!

## 2016-10-09 ENCOUNTER — Other Ambulatory Visit: Payer: Self-pay | Admitting: Obstetrics and Gynecology

## 2016-10-10 NOTE — Telephone Encounter (Signed)
eScribe request from [facility] for refill on ZOLPIDEM Last filled - 08/31/16, #30 X 0 RF Last AEX - 11/17/15 Next AEX - not scheduled Last MMG - N/A  Please advise refills. Thank you.

## 2016-10-12 NOTE — Telephone Encounter (Signed)
Patient is asking for the status of her refill request from 10/09/16. Patient is completely out of refills. The pharmacy tells her that our office has not responded to their refill request. Patient has scheduled her aex,. Patient is asking if someone would please call with an update.

## 2016-10-12 NOTE — Telephone Encounter (Signed)
Signed order faxed to [facility].  Message left for patient to advise RX has been faxed to pharmacy.

## 2016-10-12 NOTE — Telephone Encounter (Signed)
Routed to [place] who has been working on this

## 2016-11-14 ENCOUNTER — Other Ambulatory Visit: Payer: Self-pay | Admitting: Obstetrics and Gynecology

## 2016-11-14 NOTE — Telephone Encounter (Signed)
Medication refill request: Samantha West  Last AEX:  11/17/15 Dr. Quincy Simmonds Next AEX: 03/02/17 Dr. Quincy Simmonds  Last MMG (if hormonal medication request): none Refill authorized: 10/12/16 #30/0R. Today please advise.  Routed to Dr. Sabra Heck

## 2016-11-17 ENCOUNTER — Ambulatory Visit: Payer: BLUE CROSS/BLUE SHIELD | Admitting: Obstetrics and Gynecology

## 2016-12-26 ENCOUNTER — Other Ambulatory Visit: Payer: Self-pay | Admitting: Obstetrics & Gynecology

## 2016-12-26 NOTE — Telephone Encounter (Signed)
Medication refill request: Ambien Last AEX:  11/17/15 BS Next AEX: 03/02/17 Last MMG (if hormonal medication request): not done Refill authorized: 11/15/16 #30 w/0 refills; today please advise. Dr. Quincy Simmonds out office today

## 2016-12-27 NOTE — Telephone Encounter (Signed)
Forwarding to Dr. Silva 

## 2016-12-29 ENCOUNTER — Telehealth: Payer: Self-pay | Admitting: Obstetrics and Gynecology

## 2016-12-29 NOTE — Telephone Encounter (Signed)
Spoke with patient. Patient states she requested refill of zolpidem 5mg  and it was denied, would like to know why?   Reviewed refill encounter dated 12/26/16, advised per Dr. Quincy Simmonds -Refill refused: Refill not appropriate (I would like her PCP to prescribe as this is now a chronic Rx.)  Patient verbalizes understanding, patient states "Thank you" and call was ended by patient.  Routing to provider for final review. Patient is agreeable to disposition. Will close encounter.

## 2016-12-29 NOTE — Telephone Encounter (Signed)
Patient would like to speak with nurse regarding a prescription that was denied.

## 2017-01-01 ENCOUNTER — Telehealth: Payer: Self-pay | Admitting: Obstetrics and Gynecology

## 2017-01-01 NOTE — Telephone Encounter (Signed)
Spoke with patient. Patient verbalizes understanding. Will schedule an appointment with her PCP. Encounter closed.

## 2017-01-01 NOTE — Telephone Encounter (Signed)
Spoke with patient. Patient states that she was not informed the last time she filled her rx for Zolpidem that she would need to get this from her PCP. States if she would have been informed last month she would have been able to get an appointment with her PCP arranged. States she does not like the way this was handled. Patient is unable to see her PCP until the beginning of next year. "Your office also rescheduled my annual exam from August to December. That is a little far out and does not make me feel comfortable." Advised aex can be moved forward. Appointment moved to 01/08/2017 at 1 pm with Dr.Silva. Patient is agreeable to date and time. Asking if we can refill Zolpidem until she can be seen with her PCP at the beginning of 2019.

## 2017-01-01 NOTE — Telephone Encounter (Signed)
Patient is asking to talk with a nurse regarding her prescription that was denied. Patient said she is unable to get an appointment with her PCP to get this prescription. Patient said her PCP does not have any appointments until after the first of the year.

## 2017-01-01 NOTE — Telephone Encounter (Signed)
I will ok the Ambien 5 mg q hs prn, #30, RF none at this time.  I am happy to review this with her further at her office visit next week. I think she can benefit from her medical provider evaluating her sleep issues as this is outside the scope of my practice.

## 2017-01-02 ENCOUNTER — Other Ambulatory Visit: Payer: Self-pay | Admitting: Obstetrics and Gynecology

## 2017-01-02 MED ORDER — ZOLPIDEM TARTRATE 5 MG PO TABS: 5.0000 mg | ORAL_TABLET | Freq: Every day | ORAL | 0 refills | Status: DC

## 2017-01-02 NOTE — Telephone Encounter (Signed)
Patient called to check on the status of the request. She checked in with the pharmacy and they do not have it. Pharmacy confirmed.

## 2017-01-02 NOTE — Telephone Encounter (Signed)
Dr. Quincy Simmonds approved yesterday #30 with 0 refills.

## 2017-01-02 NOTE — Telephone Encounter (Signed)
Patient is calling to check on the request for Ambien.Marland Kitchen

## 2017-01-02 NOTE — Telephone Encounter (Signed)
Patient notified

## 2017-01-02 NOTE — Telephone Encounter (Signed)
Rx faxed to pharmacy today 

## 2017-01-05 NOTE — Progress Notes (Signed)
51 y.o. [id number] Divorced [place] American female here for annual exam.    Occasional hot flashes.   Living in [place] now.   Initial penetration is difficult for the patient.  Otherwise is ok.  No change in partner.   Started Ambien following her hysterectomy.  Finds herself using Ambien more with increased responsibilities.  States she does not have a sleep issue. She states she does snore.   PCP:   Dr. Mariel Aloe  Patient's last menstrual period was 08/25/2012.           Sexually active: Yes.   female The current method of family planning is status post hysterectomy--ovaries remain.    Exercising: No.   Smoker:  no  Health Maintenance: Pap:  11-12-14 Neg:Neg HR HPV History of abnormal Pap:  Yes, Hx cryotherapy to cervix 1980's--paps normal since per patient. MMG: NEVER Colonoscopy: 2017 benign polyp with Dr.Mann;next 2027. BMD:   n/a  Result  n/a TDaP:  11-12-14 Gardasil:   no DEY:CXKGY Hep C: NA. Urine today: not done   reports that  has never smoked. she has never used smokeless tobacco. She reports that she does not drink alcohol or use drugs.  Past Medical History:  Diagnosis Date  . Abnormal Pap smear of cervix    -1980s hx of colposcopy and cryotherapy to cervix--paps normal since  . Anemia 2014   prior to Hysterectomy--because of fibroid  . Fibroid   . Migraine     Past Surgical History:  Procedure Laterality Date  . APPENDECTOMY    . BREAST LUMPECTOMY     bilateral  . CESAREAN SECTION     x 3  . TUBAL LIGATION      Current Outpatient Medications  Medication Sig Dispense Refill  . zolpidem (AMBIEN) 5 MG tablet Take 1 tablet (5 mg total) by mouth at bedtime. Use as needed at bedtime. 30 tablet 0   No current facility-administered medications for this visit.     Family History  Problem Relation Age of Onset  . Hypertension Mother   . Heart failure Mother   . Stroke Mother        Dec d/t complications of stroke at age 38  . Diabetes Sister   .  Hypertension Sister   . Diabetes Paternal Grandmother     ROS:  Pertinent items are noted in HPI.  Otherwise, a comprehensive ROS was negative.  Exam:   BP 100/66 (BP Location: Right Arm, Patient Position: Sitting, Cuff Size: Normal)   Pulse 60   Resp 18   Ht 5\' 2"  (1.575 m)   Wt 133 lb (60.3 kg)   LMP 08/25/2012   BMI 24.33 kg/m     General appearance: alert, cooperative and appears stated age Head: Normocephalic, without obvious abnormality, atraumatic Neck: no adenopathy, supple, symmetrical, trachea midline and thyroid normal to inspection and palpation Lungs: clear to auscultation bilaterally Breasts: normal appearance, no masses or tenderness, No nipple retraction or dimpling, No nipple discharge or bleeding, No axillary or supraclavicular adenopathy Heart: regular rate and rhythm Abdomen: soft, non-tender; no masses, no organomegaly Extremities: extremities normal, atraumatic, no cyanosis or edema Skin: Skin color, texture, turgor normal. No rashes or lesions Lymph nodes: Cervical, supraclavicular, and axillary nodes normal. No abnormal inguinal nodes palpated Neurologic: Grossly normal  Pelvic: External genitalia:  no lesions              Urethra:  normal appearing urethra with no masses, tenderness or lesions  Bartholins and Skenes: normal                 Vagina: normal appearing vagina with normal color and discharge, no lesions              Cervix:  Absent.               Pap taken: No. Bimanual Exam:  Uterus:   Absent.              Adnexa: no mass, fullness, tenderness              Rectal exam: Yes.  .  Confirms.              Anus:  normal sphincter tone, no lesions  Chaperone was present for exam.  Assessment:   Well woman visit with normal exam. Hx low WBC and high cholesterol. Chronic Ambien use.   Plan: Mammogram screening discussed.  She will schedule. Recommended self breast awareness. Pap and HR HPV as above. Guidelines for Calcium,  Vitamin D, regular exercise program including cardiovascular and weight bearing exercise. She will do fasting labs with her PCP.  I recommend she see her PCP for future hypnotics.  We talked about the potential for dependence with Ambien and that there may be other options for her care.  Follow up annually and prn.   After visit summary provided.

## 2017-01-08 ENCOUNTER — Ambulatory Visit (INDEPENDENT_AMBULATORY_CARE_PROVIDER_SITE_OTHER): Payer: BLUE CROSS/BLUE SHIELD | Admitting: Obstetrics and Gynecology

## 2017-01-08 ENCOUNTER — Encounter: Payer: Self-pay | Admitting: Obstetrics and Gynecology

## 2017-01-08 VITALS — BP 100/66 | HR 60 | Resp 18 | Ht 62.0 in | Wt 133.0 lb

## 2017-01-08 DIAGNOSIS — Z01419 Encounter for gynecological examination (general) (routine) without abnormal findings: Secondary | ICD-10-CM

## 2017-01-08 NOTE — Patient Instructions (Signed)

## 2017-01-11 ENCOUNTER — Other Ambulatory Visit: Payer: Self-pay | Admitting: Family Medicine

## 2017-01-11 DIAGNOSIS — Z1231 Encounter for screening mammogram for malignant neoplasm of breast: Secondary | ICD-10-CM

## 2017-02-03 DIAGNOSIS — C801 Malignant (primary) neoplasm, unspecified: Secondary | ICD-10-CM

## 2017-02-03 HISTORY — DX: Malignant (primary) neoplasm, unspecified: C80.1

## 2017-02-06 ENCOUNTER — Ambulatory Visit
Admission: RE | Admit: 2017-02-06 | Discharge: 2017-02-06 | Disposition: A | Discharge status: Home / Self Care | Payer: BLUE CROSS/BLUE SHIELD | Source: Ambulatory Visit | Attending: Family Medicine | Admitting: Family Medicine

## 2017-02-06 DIAGNOSIS — Z1231 Encounter for screening mammogram for malignant neoplasm of breast: Secondary | ICD-10-CM | POA: Insufficient documentation

## 2017-02-06 DIAGNOSIS — R928 Other abnormal and inconclusive findings on diagnostic imaging of breast: Secondary | ICD-10-CM | POA: Insufficient documentation

## 2017-02-14 ENCOUNTER — Other Ambulatory Visit: Payer: Self-pay | Admitting: Family Medicine

## 2017-02-14 DIAGNOSIS — R928 Other abnormal and inconclusive findings on diagnostic imaging of breast: Secondary | ICD-10-CM

## 2017-02-14 DIAGNOSIS — N6320 Unspecified lump in the left breast, unspecified quadrant: Secondary | ICD-10-CM

## 2017-03-02 ENCOUNTER — Ambulatory Visit: Payer: BLUE CROSS/BLUE SHIELD | Admitting: Obstetrics and Gynecology

## 2017-03-06 DIAGNOSIS — Z923 Personal history of irradiation: Secondary | ICD-10-CM

## 2017-03-06 HISTORY — DX: Personal history of irradiation: Z92.3

## 2017-03-07 ENCOUNTER — Ambulatory Visit
Admission: RE | Admit: 2017-03-07 | Discharge: 2017-03-07 | Disposition: A | Discharge status: Home / Self Care | Payer: BLUE CROSS/BLUE SHIELD | Source: Ambulatory Visit | Attending: Family Medicine | Admitting: Family Medicine

## 2017-03-07 DIAGNOSIS — R928 Other abnormal and inconclusive findings on diagnostic imaging of breast: Secondary | ICD-10-CM | POA: Insufficient documentation

## 2017-03-07 DIAGNOSIS — N6324 Unspecified lump in the left breast, lower inner quadrant: Secondary | ICD-10-CM | POA: Diagnosis not present

## 2017-03-07 DIAGNOSIS — N6320 Unspecified lump in the left breast, unspecified quadrant: Secondary | ICD-10-CM

## 2017-03-07 DIAGNOSIS — N6322 Unspecified lump in the left breast, upper inner quadrant: Secondary | ICD-10-CM | POA: Insufficient documentation

## 2017-03-07 DIAGNOSIS — N6323 Unspecified lump in the left breast, lower outer quadrant: Secondary | ICD-10-CM | POA: Insufficient documentation

## 2017-03-09 ENCOUNTER — Other Ambulatory Visit: Payer: Self-pay | Admitting: Family Medicine

## 2017-03-09 DIAGNOSIS — N6320 Unspecified lump in the left breast, unspecified quadrant: Secondary | ICD-10-CM

## 2017-03-09 DIAGNOSIS — R928 Other abnormal and inconclusive findings on diagnostic imaging of breast: Secondary | ICD-10-CM

## 2017-03-22 ENCOUNTER — Ambulatory Visit: Payer: BLUE CROSS/BLUE SHIELD

## 2017-03-22 ENCOUNTER — Other Ambulatory Visit: Payer: BLUE CROSS/BLUE SHIELD

## 2017-03-29 ENCOUNTER — Ambulatory Visit
Admission: RE | Admit: 2017-03-29 | Discharge: 2017-03-29 | Disposition: A | Discharge status: Home / Self Care | Payer: BLUE CROSS/BLUE SHIELD | Source: Ambulatory Visit | Attending: Family Medicine | Admitting: Family Medicine

## 2017-03-29 DIAGNOSIS — N6320 Unspecified lump in the left breast, unspecified quadrant: Secondary | ICD-10-CM

## 2017-03-29 DIAGNOSIS — R928 Other abnormal and inconclusive findings on diagnostic imaging of breast: Secondary | ICD-10-CM

## 2017-03-29 DIAGNOSIS — C50512 Malignant neoplasm of lower-outer quadrant of left female breast: Secondary | ICD-10-CM | POA: Insufficient documentation

## 2017-03-29 HISTORY — PX: BREAST BIOPSY: SHX20

## 2017-03-30 LAB — SURGICAL PATHOLOGY

## 2017-04-02 ENCOUNTER — Other Ambulatory Visit: Payer: Self-pay | Admitting: Family Medicine

## 2017-04-02 DIAGNOSIS — R928 Other abnormal and inconclusive findings on diagnostic imaging of breast: Secondary | ICD-10-CM

## 2017-04-02 DIAGNOSIS — N6320 Unspecified lump in the left breast, unspecified quadrant: Secondary | ICD-10-CM

## 2017-04-03 ENCOUNTER — Encounter: Payer: Self-pay | Admitting: *Deleted

## 2017-04-03 NOTE — Progress Notes (Signed)
  Oncology Nurse Navigator Documentation  Navigator Location: [facility] (04/03/17 0900)   )Navigator Encounter Type: Introductory phone call (04/03/17 0900)   Abnormal Finding Date: 03/07/17 (04/03/17 0900) Confirmed Diagnosis Date: 04/02/17 (04/03/17 0900)                   Barriers/Navigation Needs: Coordination of Care (04/03/17 0900)                          Time Spent with Patient: 30 (04/03/17 0900)   Called patient to establish navigation services.  She is newly diagnosed with DCIS.  Radiology has recommended a 3rd biopsy that is scheduled for 04/11/17.  Discussed need for surgical and medical oncology consult after 3rd biopsy.  Patient is going to decide which surgeon and oncologist she would like to see and call me back.  She was encouraged to call with any questions or needs.

## 2017-04-10 ENCOUNTER — Encounter: Payer: Self-pay | Admitting: *Deleted

## 2017-04-10 NOTE — Progress Notes (Signed)
  Oncology Nurse Navigator Documentation  Navigator Location: [facility] (04/10/17 1500)   )Navigator Encounter Type: Telephone (04/10/17 1500) Telephone: [place] Call (04/10/17 1500)                                                  Time Spent with Patient: 15 (04/10/17 1500)   Patient returned my call.  I called her back, but no answer.  I have left her a message to return my call.

## 2017-04-11 ENCOUNTER — Ambulatory Visit
Admission: RE | Admit: 2017-04-11 | Discharge: 2017-04-11 | Disposition: A | Discharge status: Home / Self Care | Payer: BLUE CROSS/BLUE SHIELD | Source: Ambulatory Visit | Attending: Family Medicine | Admitting: Family Medicine

## 2017-04-11 DIAGNOSIS — N6322 Unspecified lump in the left breast, upper inner quadrant: Secondary | ICD-10-CM | POA: Diagnosis not present

## 2017-04-11 DIAGNOSIS — N6320 Unspecified lump in the left breast, unspecified quadrant: Secondary | ICD-10-CM

## 2017-04-11 DIAGNOSIS — R928 Other abnormal and inconclusive findings on diagnostic imaging of breast: Secondary | ICD-10-CM

## 2017-04-11 HISTORY — PX: BREAST BIOPSY: SHX20

## 2017-04-12 ENCOUNTER — Encounter: Payer: Self-pay | Admitting: *Deleted

## 2017-04-12 LAB — SURGICAL PATHOLOGY

## 2017-04-13 ENCOUNTER — Encounter: Payer: Self-pay | Admitting: *Deleted

## 2017-04-13 NOTE — Progress Notes (Signed)
  Oncology Nurse Navigator Documentation  Navigator Location: [facility] (04/12/17 5379)   )Navigator Encounter Type: Telephone (04/12/17 4327) Telephone: Outgoing Call (04/12/17 6147)                       Barriers/Navigation Needs: Coordination of Care (04/12/17 0929)                          Time Spent with Patient: 15 (04/12/17 5747)   Left patient a message to return my call.

## 2017-04-13 NOTE — Progress Notes (Signed)
  Oncology Nurse Navigator Documentation  Navigator Location: [facility] (04/13/17 0900)   )Navigator Encounter Type: Telephone (04/13/17 0900) Telephone: [place]ahoma Crocker Call (04/13/17 0900)                       Barriers/Navigation Needs: Coordination of Care (04/13/17 0900)                          Time Spent with Patient: 15 (04/13/17 0900)   I have left the patient another message to return my call.  I would like to assist with scheduling her surgical and medical oncology consult.

## 2017-04-16 ENCOUNTER — Encounter: Payer: Self-pay | Admitting: *Deleted

## 2017-04-16 NOTE — Progress Notes (Signed)
I have left the patient another message to return my call.  I would like to schedule her surgical consult and medical oncology consult.  This is the 4th message I have left her.  I called Dr. Waylan Boga office today and informed them of the patient's diagnosis and that I have not been able to get in touch with her.  Her office is going to call me back after Dr. Iona Beard has been notified.

## 2017-04-17 ENCOUNTER — Encounter: Payer: Self-pay | Admitting: *Deleted

## 2017-04-17 NOTE — Progress Notes (Signed)
  Oncology Nurse Navigator Documentation  Navigator Location: [facility] (04/17/17 1600)   )Navigator Encounter Type: Telephone (04/17/17 1600) Telephone: Incoming Call (04/17/17 1600)                       Barriers/Navigation Needs: Coordination of Care (04/17/17 1600)                          Time Spent with Patient: 30 (04/17/17 1600)   Patient called me today to discuss changing her surgical consult from Dr. Peyton Najjar to Dr. Bary Castilla.  I have cancelled the appointment with Dr. Peyton Najjar.  Talked to [facility] today at Dr. Dwyane Luo office.  She said they would need to work in a time for the patient and Sharyn Lull was gone for the day to assist with that time.  She asked that the patient call in the morning to get scheduled.  Number given to patient.  She is to call if she has any questions or needs.

## 2017-04-20 ENCOUNTER — Encounter: Payer: Self-pay | Admitting: *Deleted

## 2017-04-23 ENCOUNTER — Ambulatory Visit: Payer: BLUE CROSS/BLUE SHIELD | Admitting: General Surgery

## 2017-04-23 ENCOUNTER — Inpatient Hospital Stay: Payer: Self-pay

## 2017-04-23 ENCOUNTER — Encounter: Payer: Self-pay | Admitting: General Surgery

## 2017-04-23 ENCOUNTER — Encounter: Payer: Self-pay | Admitting: *Deleted

## 2017-04-23 VITALS — BP 113/72 | HR 78 | Resp 14 | Ht 62.0 in | Wt 131.4 lb

## 2017-04-23 DIAGNOSIS — D0512 Intraductal carcinoma in situ of left breast: Secondary | ICD-10-CM | POA: Diagnosis not present

## 2017-04-23 DIAGNOSIS — N6323 Unspecified lump in the left breast, lower outer quadrant: Secondary | ICD-10-CM

## 2017-04-23 NOTE — Progress Notes (Signed)
Patient's surgery has been scheduled for 05-16-17 at [facility].

## 2017-04-23 NOTE — Progress Notes (Signed)
Patient ID: Samantha West, female   DOB: Feb 01, 1966, 52 y.o.   MRN: [id number]  Chief Complaint  Patient presents with  . Other    breast discussion     HPI Isaac Lacson Basques is a 52 y.o. female who presents for a breast evaluation and discussion of biopsy results. The most recent mammogram was done on 02/06/17, with added views and ultrasound of the left breast on 03/07/17. She subsequently had a biopsy of the left breast on 03/29/17. Patient does perform regular self breast checks and this was her first mammogram. She states that she had no problems with the breasts prior to her mammogram.   She has had several cysts removed from the breasts as a teenager.  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  HPI  Past Medical History:  Diagnosis Date  . Abnormal Pap smear of cervix    -1980s hx of colposcopy and cryotherapy to cervix--paps normal since  . Anemia 2014   prior to Hysterectomy--because of fibroid  . Fibroid   . Migraine     Past Surgical History:  Procedure Laterality Date  . ABDOMINAL HYSTERECTOMY  2014  . APPENDECTOMY  1986  . 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 BREAST BIOPSY Left 03/29/2017   subareolar wing clip- fibroadenoma  . BREAST BIOPSY Left 04/11/2017   9:00 ribbon clip-path pending  . BREAST CYST EXCISION Left 1982   3 cyst removed  . BREAST CYST EXCISION Right 1982   4 cyst removed  . CESAREAN SECTION     x 3  . COLONOSCOPY W/ BIOPSIES  12/29/2015   Tubular adenoma without Tomasa Hosteller, [place],.  . CYSTOSCOPY N/A 10/08/2012   Procedure: CYSTOSCOPY;  Surgeon: Azalia Bilis, MD;  Location: [facility];  Service: Gynecology;  Laterality: N/A;  . ROBOTIC ASSISTED TOTAL HYSTERECTOMY N/A 10/08/2012   Procedure: ROBOTIC ASSISTED TOTAL HYSTERECTOMY WITH BILATERAL SALPINGECTOMY;  Surgeon: Azalia Bilis, MD;  Location: [facility];  Service: Gynecology;  Laterality: N/A;  . TUBAL LIGATION      Family History  Problem  Relation Age of Onset  . Hypertension Mother   . Heart failure Mother   . Stroke Mother        Dec d/t complications of stroke at age 52  . Diabetes Sister   . Hypertension Sister   . Diabetes Paternal Grandmother   . Breast cancer Neg Hx     Social History Social History   Tobacco Use  . Smoking status: Never Smoker  . Smokeless tobacco: Never Used  Substance Use Topics  . Alcohol use: No    Alcohol/week: 0.0 oz  . Drug use: No    No Known Allergies  Current Outpatient Medications  Medication Sig Dispense Refill  . zolpidem (AMBIEN) 5 MG tablet Take 1 tablet (5 mg total) by mouth at bedtime. Use as needed at bedtime. 30 tablet 0   No current facility-administered medications for this visit.     Review of Systems Review of Systems  Constitutional: Negative.   Respiratory: Negative.   Cardiovascular: Negative.     Blood pressure 113/72, pulse 78, resp. rate 14, height 5\' 2"  (1.575 m), weight 131 lb 6.4 oz (59.6 kg), last menstrual period 08/25/2012.  Physical Exam Physical Exam  Constitutional: She is oriented to person, place, and time. She appears well-developed and well-nourished.  Eyes: Conjunctivae are normal. No scleral icterus.  Neck: Neck supple.  Cardiovascular: Normal rate,  regular rhythm and normal heart sounds.  Pulmonary/Chest: Effort normal and breath sounds normal. Right breast exhibits no inverted nipple, no mass, no nipple discharge, no skin change and no tenderness. Left breast exhibits no inverted nipple, no mass, no nipple discharge, no skin change and no tenderness.  Lymphadenopathy:    She has no cervical adenopathy.  Neurological: She is alert and oriented to person, place, and time.  Skin: Skin is warm and dry.    Data Reviewed The patient's first screening mammogram was completed on February 06, 2017. Dense breasts were reported.  Possible mass in the left breast warranting further evaluation. Diagnostic mammogram and ultrasound of the  left breast dated March 07, 2017 was reviewed.  An irregular mass in the lower outer quadrant on ultrasound showed a hypoechoic mass 3 cm from the nipple at the 4 o'clock position measuring 0.6 x 0.8 x 0.9 cm.  This was reported as tolerable rather than wide  In the subareolar breast a circumscribed hypoechoic mass up to 1.2 cm was identified.  At the 930 o'clock position of the left breast a 1.5 cm maximum diameter mass without internal vascular flow was noted.  March 29, 2017 biopsy results: A. BREAST, LEFT, 4 O'CLOCK; ULTRASOUND-GUIDED CORE BIOPSY:  - DUCTAL CARCINOMA IN SITU (DCIS), HIGH-GRADE, WITH COMEDONECROSIS, SEE  COMMENT BELOW.   B. BREAST, LEFT, SUBAREOLAR; ULTRASOUND-GUIDED CORE BIOPSY:  - FIBROADENOMA.  - NEGATIVE FOR ATYPIA AND MALIGNANCY.   Comment:  No invasive carcinoma is identified in part A; there may be a cystic  component to the lesion. DCIS measures about 5 mm in this sample. Assessment of ER and PR is deferred to the excised specimen.   April 11, 2017 left breast biopsy report: A. LEFT BREAST, 9:30; ULTRASOUND-GUIDED CORE BIOPSY:  - CONSISTENT WITH FIBROADENOMA.   Ultrasound examination of the left breast was undertaken to determine if preoperative wire localization would be required.  In the 9 o'clock position 3 cm from the nipple a well-defined smoothly marginated hypoechoic mass measuring 0.8 x 0.95 x 1.24 cm with a central clip is identified corresponding to the mammogram and biopsy of a fibroadenoma.  The retroareolar area is somewhat distorted with modest inflammatory changes and the mass described on the hospital ultrasound is not well-visualized.  In the left breast at the 4 o'clock position, 3.5 cm from the nipple a well-defined hypoechoic mass with evidence of a biopsy clip within it measuring 0.5 x 0.64 x 0.71 cm is identified.  A biopsy clip is evident within this lesion.  This corresponds to the area of high-grade DCIS.  Laboratory studies of  November 17, 2015 showed a white blood cell count of 3100, unchanged from 3 2 years earlier.  3 years earlier the white blood cell count was 9700.  Hemoglobin 13.3 with an MCV of 84.  Platelet count 217,000.  Comprehensive metabolic panel of the same date is unremarkable.  Assessment    High-grade DCIS involving the left breast.    Plan    Indications for formal excision to confirm no additional pathology (upstaging to invasive cancer) and tube change clear surgical margins were reviewed.  The potential for postoperative radiation therapy and hormone suppression if appropriate (receptor status pending final resection)     HPI, Physical Exam, Assessment and Plan have been scribed under the direction and in the presence of Robert Bellow, MD  Concepcion Living, LPN  I have completed the exam and reviewed the above documentation for accuracy and completeness.  I  agree with the above.  Haematologist has been used and any errors in dictation or transcription are unintentional.  Hervey Ard, M.D., F.A.C.S.  was reviewed. Forest Gleason Judeen Geralds 04/23/2017, 9:29 PM

## 2017-04-26 ENCOUNTER — Encounter: Payer: Self-pay | Admitting: *Deleted

## 2017-05-09 ENCOUNTER — Encounter
Admission: RE | Admit: 2017-05-09 | Discharge: 2017-05-09 | Disposition: A | Discharge status: Home / Self Care | Payer: BLUE CROSS/BLUE SHIELD | Source: Ambulatory Visit | Attending: General Surgery | Admitting: General Surgery

## 2017-05-09 ENCOUNTER — Other Ambulatory Visit: Payer: Self-pay

## 2017-05-09 HISTORY — DX: Malignant (primary) neoplasm, unspecified: C80.1

## 2017-05-09 HISTORY — DX: Personal history of other diseases of the digestive system: Z87.19

## 2017-05-09 NOTE — Patient Instructions (Signed)
Your procedure is scheduled on: 05-16-17 [facility] Report to Same Day Surgery 2nd floor medical mall [facility] Entrance-take elevator on left to 2nd floor.  Check in with surgery information desk.) To find out your arrival time please call [phone] between 1PM - 3PM on 05-15-17 TUESDAY  Remember: Instructions that are not followed completely may result in serious medical risk, up to and including death, or upon the discretion of your surgeon and anesthesiologist your surgery may need to be rescheduled.    _x___ 1. Do not eat food after midnight the night before your procedure. NO GUM OR CANDY AFTER MIDNIGHT.  You may drink clear liquids up to 2 hours before you are scheduled to arrive at the hospital for your procedure.  Do not drink clear liquids within 2 hours of your scheduled arrival to the hospital.  Clear liquids include  --Water or Apple juice without pulp  --Clear carbohydrate beverage such as ClearFast or Gatorade  --Black Coffee or Clear Tea (No milk, no creamers, do not add anything to  the coffee or Tea     __x__ 2. No Alcohol for 24 hours before or after surgery.   __x__3. No Smoking or e-cigarettes for 24 prior to surgery.  Do not use any chewable tobacco products for at least 6 hour prior to surgery   ____  4. Bring all medications with you on the day of surgery if instructed.    __x__ 5. Notify your doctor if there is any change in your medical condition     (cold, fever, infections).    x___6. On the morning of surgery brush your teeth with toothpaste and water.  You may rinse your mouth with mouth wash if you wish.  Do not swallow any toothpaste or mouthwash.   Do not wear jewelry, make-up, hairpins, clips or nail polish.  Do not wear lotions, powders, or perfumes. You may wear deodorant.  Do not shave 48 hours prior to surgery. Men may shave face and neck.  Do not bring valuables to the hospital.    [facility] is not responsible for any belongings or  valuables.               Contacts, dentures or bridgework may not be worn into surgery.  Leave your suitcase in the car. After surgery it may be brought to your room.  For patients admitted to the hospital, discharge time is determined by your treatment team.  _  Patients discharged the day of surgery will not be allowed to drive home.  You will need someone to drive you home and stay with you the night of your procedure.    Please read over the following fact sheets that you were given:   [facility] Preparing for Surgery and or MRSA Information   ____ Take anti-hypertensive listed below, cardiac, seizure, asthma, anti-reflux and psychiatric medicines. These include:  1. NONE  2.  3.  4.  5.  6.  ____Fleets enema or Magnesium Citrate as directed.   _x___ Use CHG Soap or sage wipes as directed on instruction sheet   ____ Use inhalers on the day of surgery and bring to hospital day of surgery  ____ Stop Metformin and Janumet 2 days prior to surgery.    ____ Take 1/2 of usual insulin dose the night before surgery and none on the morning surgery.   ____ Follow recommendations from Cardiologist, Pulmonologist or PCP regarding stopping Aspirin, Coumadin, Plavix ,Eliquis, Effient, or Pradaxa, and Pletal.  X____Stop Anti-inflammatories such as Advil, Aleve, Ibuprofen, Motrin, Naproxen, Naprosyn, Goodies powders or aspirin products-PT STATES SHE ALREADY STOPPED HER BC POWDERS AND IS ONLY TAKING TYLENOL NOW   ____ Stop supplements until after surgery.     ____ Bring C-Pap to the hospital.

## 2017-05-11 ENCOUNTER — Inpatient Hospital Stay: Admission: RE | Admit: 2017-05-11 | Payer: BLUE CROSS/BLUE SHIELD | Source: Ambulatory Visit

## 2017-05-14 ENCOUNTER — Encounter
Admission: RE | Admit: 2017-05-14 | Discharge: 2017-05-14 | Disposition: A | Discharge status: Home / Self Care | Payer: BLUE CROSS/BLUE SHIELD | Source: Ambulatory Visit | Attending: General Surgery | Admitting: General Surgery

## 2017-05-14 ENCOUNTER — Telehealth: Payer: Self-pay | Admitting: *Deleted

## 2017-05-14 DIAGNOSIS — Z8249 Family history of ischemic heart disease and other diseases of the circulatory system: Secondary | ICD-10-CM | POA: Diagnosis not present

## 2017-05-14 DIAGNOSIS — Z823 Family history of stroke: Secondary | ICD-10-CM | POA: Diagnosis not present

## 2017-05-14 DIAGNOSIS — D0512 Intraductal carcinoma in situ of left breast: Secondary | ICD-10-CM | POA: Diagnosis present

## 2017-05-14 DIAGNOSIS — Z79899 Other long term (current) drug therapy: Secondary | ICD-10-CM | POA: Diagnosis not present

## 2017-05-14 DIAGNOSIS — G43909 Migraine, unspecified, not intractable, without status migrainosus: Secondary | ICD-10-CM | POA: Diagnosis not present

## 2017-05-14 DIAGNOSIS — Z90710 Acquired absence of both cervix and uterus: Secondary | ICD-10-CM | POA: Diagnosis not present

## 2017-05-14 DIAGNOSIS — Z833 Family history of diabetes mellitus: Secondary | ICD-10-CM | POA: Diagnosis not present

## 2017-05-14 LAB — CBC WITH DIFFERENTIAL/PLATELET
BASOS ABS: 0 10*3/uL (ref 0–0.1)
BASOS PCT: 1 %
EOS ABS: 0.1 10*3/uL (ref 0–0.7)
Eosinophils Relative: 3 %
HEMATOCRIT: 38.7 % (ref 35.0–47.0)
Hemoglobin: 12.6 g/dL (ref 12.0–16.0)
Lymphocytes Relative: 52 %
Lymphs Abs: 2.1 10*3/uL (ref 1.0–3.6)
MCH: 27.5 pg (ref 26.0–34.0)
MCHC: 32.4 g/dL (ref 32.0–36.0)
MCV: 84.8 fL (ref 80.0–100.0)
MONO ABS: 0.3 10*3/uL (ref 0.2–0.9)
Monocytes Relative: 7 %
NEUTROS ABS: 1.5 10*3/uL (ref 1.4–6.5)
NEUTROS PCT: 37 %
Platelets: 198 10*3/uL (ref 150–440)
RBC: 4.57 MIL/uL (ref 3.80–5.20)
RDW: 14.4 % (ref 11.5–14.5)
WBC: 4 10*3/uL (ref 3.6–11.0)

## 2017-05-14 NOTE — Telephone Encounter (Signed)
Spoke with patient and went over general questions about her lumpectomy and pain expectations. All questions answered. Patient is aware to call back if she has any further questions.

## 2017-05-14 NOTE — Telephone Encounter (Signed)
Patient is having a left breast lumpectomy on 05/16/17 and she wanted to know how invasive the surgery is. She stated that if she does not answer to leave a message on her voicemail box.

## 2017-05-16 ENCOUNTER — Ambulatory Visit
Admission: RE | Admit: 2017-05-16 | Discharge: 2017-05-16 | Disposition: A | Discharge status: Home / Self Care | Payer: BLUE CROSS/BLUE SHIELD | Source: Ambulatory Visit | Attending: General Surgery | Admitting: General Surgery

## 2017-05-16 ENCOUNTER — Ambulatory Visit: Payer: BLUE CROSS/BLUE SHIELD | Admitting: Anesthesiology

## 2017-05-16 ENCOUNTER — Ambulatory Visit: Payer: BLUE CROSS/BLUE SHIELD

## 2017-05-16 ENCOUNTER — Encounter: Payer: Self-pay | Admitting: *Deleted

## 2017-05-16 ENCOUNTER — Encounter
Admission: RE | Disposition: A | Discharge status: Home / Self Care | Payer: Self-pay | Source: Ambulatory Visit | Attending: General Surgery

## 2017-05-16 DIAGNOSIS — C50512 Malignant neoplasm of lower-outer quadrant of left female breast: Secondary | ICD-10-CM | POA: Diagnosis not present

## 2017-05-16 DIAGNOSIS — Z90710 Acquired absence of both cervix and uterus: Secondary | ICD-10-CM | POA: Insufficient documentation

## 2017-05-16 DIAGNOSIS — Z8249 Family history of ischemic heart disease and other diseases of the circulatory system: Secondary | ICD-10-CM | POA: Insufficient documentation

## 2017-05-16 DIAGNOSIS — D0512 Intraductal carcinoma in situ of left breast: Secondary | ICD-10-CM | POA: Insufficient documentation

## 2017-05-16 DIAGNOSIS — G43909 Migraine, unspecified, not intractable, without status migrainosus: Secondary | ICD-10-CM | POA: Insufficient documentation

## 2017-05-16 DIAGNOSIS — N6320 Unspecified lump in the left breast, unspecified quadrant: Secondary | ICD-10-CM

## 2017-05-16 DIAGNOSIS — Z823 Family history of stroke: Secondary | ICD-10-CM | POA: Insufficient documentation

## 2017-05-16 DIAGNOSIS — Z79899 Other long term (current) drug therapy: Secondary | ICD-10-CM | POA: Insufficient documentation

## 2017-05-16 DIAGNOSIS — Z833 Family history of diabetes mellitus: Secondary | ICD-10-CM | POA: Insufficient documentation

## 2017-05-16 HISTORY — PX: BREAST LUMPECTOMY: SHX2

## 2017-05-16 SURGERY — BREAST LUMPECTOMY
Anesthesia: General | Site: Breast | Laterality: Left | Wound class: Clean

## 2017-05-16 MED ORDER — MIDAZOLAM HCL 2 MG/2ML IJ SOLN
INTRAMUSCULAR | Status: DC | PRN
  Administered 2017-05-16: 2 mg via INTRAVENOUS

## 2017-05-16 MED ORDER — CELECOXIB 200 MG PO CAPS
200.0000 mg | ORAL_CAPSULE | ORAL | Status: AC
  Administered 2017-05-16: 200 mg via ORAL

## 2017-05-16 MED ORDER — ACETAMINOPHEN NICU IV SYRINGE 10 MG/ML
INTRAVENOUS | Status: AC
  Filled 2017-05-16: qty 1

## 2017-05-16 MED ORDER — PROPOFOL 10 MG/ML IV BOLUS
INTRAVENOUS | Status: DC | PRN
  Administered 2017-05-16: 50 mg via INTRAVENOUS
  Administered 2017-05-16: 150 mg via INTRAVENOUS

## 2017-05-16 MED ORDER — OXYCODONE HCL 5 MG PO TABS: 5.0000 mg | ORAL_TABLET | Freq: Once | ORAL | Status: DC | PRN

## 2017-05-16 MED ORDER — HYDROCODONE-ACETAMINOPHEN 5-325 MG PO TABS: 1.0000 | ORAL_TABLET | ORAL | 0 refills | Status: DC | PRN

## 2017-05-16 MED ORDER — ONDANSETRON HCL 4 MG/2ML IJ SOLN
INTRAMUSCULAR | Status: DC | PRN
  Administered 2017-05-16: 4 mg via INTRAVENOUS

## 2017-05-16 MED ORDER — FENTANYL CITRATE (PF) 100 MCG/2ML IJ SOLN
INTRAMUSCULAR | Status: AC
  Administered 2017-05-16: 50 ug via INTRAVENOUS
  Filled 2017-05-16: qty 2

## 2017-05-16 MED ORDER — GABAPENTIN 300 MG PO CAPS
300.0000 mg | ORAL_CAPSULE | ORAL | Status: AC
  Administered 2017-05-16: 300 mg via ORAL

## 2017-05-16 MED ORDER — GABAPENTIN 300 MG PO CAPS
ORAL_CAPSULE | ORAL | Status: AC
  Administered 2017-05-16: 300 mg via ORAL
  Filled 2017-05-16: qty 1

## 2017-05-16 MED ORDER — ACETAMINOPHEN 10 MG/ML IV SOLN
INTRAVENOUS | Status: DC | PRN
  Administered 2017-05-16: 1000 mg via INTRAVENOUS

## 2017-05-16 MED ORDER — FENTANYL CITRATE (PF) 100 MCG/2ML IJ SOLN
INTRAMUSCULAR | Status: DC | PRN
  Administered 2017-05-16 (×2): 50 ug via INTRAVENOUS

## 2017-05-16 MED ORDER — LACTATED RINGERS IV SOLN
INTRAVENOUS | Status: DC
  Administered 2017-05-16 (×2): via INTRAVENOUS

## 2017-05-16 MED ORDER — FAMOTIDINE 20 MG PO TABS
20.0000 mg | ORAL_TABLET | Freq: Once | ORAL | Status: AC
  Administered 2017-05-16: 20 mg via ORAL

## 2017-05-16 MED ORDER — BUPIVACAINE-EPINEPHRINE (PF) 0.5% -1:200000 IJ SOLN
INTRAMUSCULAR | Status: DC | PRN
  Administered 2017-05-16: 20 mL via PERINEURAL

## 2017-05-16 MED ORDER — BUPIVACAINE-EPINEPHRINE (PF) 0.5% -1:200000 IJ SOLN
INTRAMUSCULAR | Status: AC
  Filled 2017-05-16: qty 30

## 2017-05-16 MED ORDER — MEPERIDINE HCL 50 MG/ML IJ SOLN: 6.2500 mg | INTRAMUSCULAR | Status: DC | PRN

## 2017-05-16 MED ORDER — PROMETHAZINE HCL 25 MG/ML IJ SOLN: 6.2500 mg | INTRAMUSCULAR | Status: DC | PRN

## 2017-05-16 MED ORDER — MIDAZOLAM HCL 2 MG/2ML IJ SOLN
INTRAMUSCULAR | Status: AC
  Filled 2017-05-16: qty 2

## 2017-05-16 MED ORDER — FENTANYL CITRATE (PF) 100 MCG/2ML IJ SOLN
25.0000 ug | INTRAMUSCULAR | Status: DC | PRN
  Administered 2017-05-16 (×2): 25 ug via INTRAVENOUS
  Administered 2017-05-16: 50 ug via INTRAVENOUS

## 2017-05-16 MED ORDER — CELECOXIB 200 MG PO CAPS
ORAL_CAPSULE | ORAL | Status: AC
  Administered 2017-05-16: 200 mg via ORAL
  Filled 2017-05-16: qty 1

## 2017-05-16 MED ORDER — DEXAMETHASONE SODIUM PHOSPHATE 10 MG/ML IJ SOLN
INTRAMUSCULAR | Status: DC | PRN
  Administered 2017-05-16: 10 mg via INTRAVENOUS

## 2017-05-16 MED ORDER — LIDOCAINE HCL (CARDIAC) 20 MG/ML IV SOLN
INTRAVENOUS | Status: DC | PRN
  Administered 2017-05-16: 60 mg via INTRAVENOUS

## 2017-05-16 MED ORDER — OXYCODONE HCL 5 MG/5ML PO SOLN: 5.0000 mg | Freq: Once | ORAL | Status: DC | PRN

## 2017-05-16 MED ORDER — FAMOTIDINE 20 MG PO TABS
ORAL_TABLET | ORAL | Status: AC
  Administered 2017-05-16: 20 mg via ORAL
  Filled 2017-05-16: qty 1

## 2017-05-16 MED ORDER — FENTANYL CITRATE (PF) 100 MCG/2ML IJ SOLN
INTRAMUSCULAR | Status: AC
  Filled 2017-05-16: qty 2

## 2017-05-16 SURGICAL SUPPLY — 52 items
BINDER BREAST LRG (GAUZE/BANDAGES/DRESSINGS) IMPLANT
BINDER BREAST MEDIUM (GAUZE/BANDAGES/DRESSINGS) ×1 IMPLANT
BINDER BREAST XLRG (GAUZE/BANDAGES/DRESSINGS) IMPLANT
BLADE SURG 15 STRL SS SAFETY (BLADE) ×4 IMPLANT
BULB RESERV EVAC DRAIN JP 100C (MISCELLANEOUS) IMPLANT
CANISTER SUCT 1200ML W/VALVE (MISCELLANEOUS) ×2 IMPLANT
CHLORAPREP W/TINT 26ML (MISCELLANEOUS) ×2 IMPLANT
CNTNR SPEC 2.5X3XGRAD LEK (MISCELLANEOUS) ×1
CONT SPEC 4OZ STER OR WHT (MISCELLANEOUS) ×1
CONT SPEC 4OZ STRL OR WHT (MISCELLANEOUS) ×1
CONTAINER SPEC 2.5X3XGRAD LEK (MISCELLANEOUS) ×1 IMPLANT
COVER PROBE FLX POLY STRL (MISCELLANEOUS) ×2 IMPLANT
DEVICE DUBIN SPECIMEN MAMMOGRA (MISCELLANEOUS) ×1 IMPLANT
DRAIN CHANNEL JP 15F RND 16 (MISCELLANEOUS) IMPLANT
DRAPE LAPAROTOMY TRNSV 106X77 (MISCELLANEOUS) ×2 IMPLANT
DRSG TELFA 3X8 NADH (GAUZE/BANDAGES/DRESSINGS) ×2 IMPLANT
ELECT CAUTERY BLADE TIP 2.5 (TIP) ×2
ELECT REM PT RETURN 9FT ADLT (ELECTROSURGICAL) ×2
ELECTRODE CAUTERY BLDE TIP 2.5 (TIP) ×1 IMPLANT
ELECTRODE REM PT RTRN 9FT ADLT (ELECTROSURGICAL) ×1 IMPLANT
GAUZE FLUFF 18X24 1PLY STRL (GAUZE/BANDAGES/DRESSINGS) ×2 IMPLANT
GLOVE BIO SURGEON STRL SZ7.5 (GLOVE) ×2 IMPLANT
GLOVE INDICATOR 8.0 STRL GRN (GLOVE) ×2 IMPLANT
GOWN STRL REUS W/ TWL LRG LVL3 (GOWN DISPOSABLE) ×2 IMPLANT
GOWN STRL REUS W/TWL LRG LVL3 (GOWN DISPOSABLE) ×4
KIT TURNOVER KIT A (KITS) ×2 IMPLANT
LABEL OR SOLS (LABEL) ×2 IMPLANT
MARGIN MAP 10MM (MISCELLANEOUS) ×2 IMPLANT
NDL HYPO 25X1 1.5 SAFETY (NEEDLE) ×2 IMPLANT
NEEDLE HYPO 22GX1.5 SAFETY (NEEDLE) ×2 IMPLANT
NEEDLE HYPO 25X1 1.5 SAFETY (NEEDLE) ×4 IMPLANT
PACK BASIN MINOR ARMC (MISCELLANEOUS) ×2 IMPLANT
PAD DRESSING TELFA 3X8 NADH (GAUZE/BANDAGES/DRESSINGS) ×1 IMPLANT
RETRACTOR RING XSMALL (MISCELLANEOUS) IMPLANT
RTRCTR WOUND ALEXIS 13CM XS SH (MISCELLANEOUS) ×2
SHEARS FOC LG CVD HARMONIC 17C (MISCELLANEOUS) IMPLANT
SHEARS HARMONIC 9CM CVD (BLADE) IMPLANT
STRIP CLOSURE SKIN 1/2X4 (GAUZE/BANDAGES/DRESSINGS) ×2 IMPLANT
SUT ETHILON 3-0 FS-10 30 BLK (SUTURE) ×2
SUT SILK 2 0 (SUTURE) ×2
SUT SILK 2-0 18XBRD TIE 12 (SUTURE) ×1 IMPLANT
SUT VIC AB 2-0 CT1 27 (SUTURE) ×4
SUT VIC AB 2-0 CT1 TAPERPNT 27 (SUTURE) ×2 IMPLANT
SUT VIC AB 4-0 FS2 27 (SUTURE) ×2 IMPLANT
SUT VICRYL+ 3-0 144IN (SUTURE) ×2 IMPLANT
SUTURE EHLN 3-0 FS-10 30 BLK (SUTURE) ×1 IMPLANT
SWABSTK COMLB BENZOIN TINCTURE (MISCELLANEOUS) ×2 IMPLANT
SYR 10ML LL (SYRINGE) ×2 IMPLANT
SYR BULB IRRIG 60ML STRL (SYRINGE) ×2 IMPLANT
SYR CONTROL 10ML (SYRINGE) ×2 IMPLANT
TAPE TRANSPORE STRL 2 31045 (GAUZE/BANDAGES/DRESSINGS) ×2 IMPLANT
WATER STERILE IRR 1000ML POUR (IV SOLUTION) ×2 IMPLANT

## 2017-05-16 NOTE — Transfer of Care (Signed)
Immediate Anesthesia Transfer of Care Note  Patient: Samantha West  Procedure(s) Performed: BREAST LUMPECTOMY (Left Breast)  Patient Location: PACU  Anesthesia Type:General  Level of Consciousness: awake  Airway & Oxygen Therapy: Patient Spontanous Breathing  Post-op Assessment: Report given to RN and Post -op Vital signs reviewed and stable  Post vital signs: stable  Last Vitals:  Vitals:   05/16/17 1011 05/16/17 1208  BP: 140/86 128/85  Pulse: 62 77  Resp: 18 12  Temp: 36.6 C (P) 36.7 C  SpO2: 100% 100%    Last Pain:  Vitals:   05/16/17 1011  TempSrc: Oral  PainSc: 2          Complications: No apparent anesthesia complications

## 2017-05-16 NOTE — Anesthesia Preprocedure Evaluation (Addendum)
Anesthesia Evaluation  Patient identified by MRN, date of birth, ID band Patient awake    Reviewed: Allergy & Precautions, NPO status , Patient's Chart, lab work & pertinent test results  History of Anesthesia Complications Negative for: history of anesthetic complications  Airway Mallampati: III  TM Distance: >3 FB Neck ROM: Full    Dental no notable dental hx.    Pulmonary neg pulmonary ROS, neg sleep apnea, neg COPD,    breath sounds clear to auscultation- rhonchi (-) wheezing      Cardiovascular Exercise Tolerance: Good (-) hypertension(-) CAD, (-) Past MI, (-) Cardiac Stents and (-) CABG  Rhythm:Regular Rate:Normal - Systolic murmurs and - Diastolic murmurs    Neuro/Psych  Headaches, negative psych ROS   GI/Hepatic Neg liver ROS, hiatal hernia,   Endo/Other  negative endocrine ROSneg diabetes  Renal/GU negative Renal ROS     Musculoskeletal negative musculoskeletal ROS (+)   Abdominal (+) - obese,   Peds  Hematology  (+) anemia ,   Anesthesia Other Findings Past Medical History: No date: Abnormal Pap smear of cervix     Comment:  -1980s hx of colposcopy and cryotherapy to cervix--paps               normal since 2014: Anemia     Comment:  prior to Hysterectomy--because of fibroid No date: Cancer ([place]) No date: Fibroid No date: History of hiatal hernia     Comment:  small No date: Migraine     Comment:  h/o migraines prior to hysterectomy   Reproductive/Obstetrics                             Anesthesia Physical Anesthesia Plan  ASA: II  Anesthesia Plan: General   Post-op Pain Management:    Induction: Intravenous  PONV Risk Score and Plan: 2 and Ondansetron, Dexamethasone and Midazolam  Airway Management Planned: LMA  Additional Equipment:   Intra-op Plan:   Post-operative Plan:   Informed Consent: I have reviewed the patients History and Physical, chart, labs  and discussed the procedure including the risks, benefits and alternatives for the proposed anesthesia with the patient or authorized representative who has indicated his/her understanding and acceptance.   Dental advisory given  Plan Discussed with: CRNA and Anesthesiologist  Anesthesia Plan Comments:        Anesthesia Quick Evaluation

## 2017-05-16 NOTE — Discharge Instructions (Signed)

## 2017-05-16 NOTE — H&P (Signed)
No interval change since last OV. 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

## 2017-05-16 NOTE — Op Note (Signed)
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  Postoperative diagnosis: Same.  Operative procedure: Wide local excision with ultrasound guidance.  Operating Surgeon: Hervey Ard, MD.  Anesthesia, General by LMA, Marcaine 0.5% with 1-200,000 units of epinephrine, 20 cc.  Clinical note: This 52 year old woman was recently diagnosed with high-grade DCIS and is desirous of breast conservation.  She is brought to the operating for planned wide local excision.  Operative note: With the patient under adequate general anesthesia the breast was cleansed with ChloraPrep and draped.  Marcaine was infiltrated for postoperative analgesia.  Ultrasound was used to confirm location of the area of previous biopsy in the lower outer quadrant, 4-5:00 about 3-4 cm from the nipple.  A radial incision beginning at the edge of the areola was extending down towards the inframammary fold.  The skin was incised sharply and remaining dissection completed with electrocautery.  A extra small Alexis wound protector was placed.  A 3 x 3 x 1.5 cm block of tissue extending from the subcutaneous fat down to and including the pectoralis fascia was excised, specimen orientated and specimen radiograph obtained.  The clip was in the center of the specimen and it was near the superficial surface.  The margins were grossly negative on report from pathology, with the clip again near the superficial margin which was the subtendinous fat.  The pectoralis fascia was elevated off the underlying muscle circumferentially and then approximated with interrupted 2-0 Vicryl sutures.  The breast parenchyma was then approximated in similar fashion.  The adipose layer was freed circumferentially for an additional centimeter with cautery and then approximated with interrupted 2-0 Vicryl sutures.  The skin was closed with a running 4-0 Vicryl subcuticular suture.  Benzoin, Steri-Strips, Telfa and Tegaderm dressing applied.  The  patient tolerated the procedure well and was taken to recovery room in stable condition.

## 2017-05-16 NOTE — Anesthesia Postprocedure Evaluation (Signed)
Anesthesia Post Note  Patient: Samantha West  Procedure(s) Performed: BREAST LUMPECTOMY (Left Breast)  Patient location during evaluation: PACU Anesthesia Type: General Level of consciousness: awake and alert Pain management: pain level controlled Vital Signs Assessment: post-procedure vital signs reviewed and stable Respiratory status: spontaneous breathing, nonlabored ventilation, respiratory function stable and patient connected to nasal cannula oxygen Cardiovascular status: blood pressure returned to baseline and stable Postop Assessment: no apparent nausea or vomiting Anesthetic complications: no     Last Vitals:  Vitals:   05/16/17 1253 05/16/17 1306  BP: 127/84 128/82  Pulse: 67 64  Resp: 19 17  Temp:  36.4 C  SpO2: 100% 100%    Last Pain:  Vitals:   05/16/17 1306  TempSrc: Temporal  PainSc: 3                  Precious Haws Piscitello

## 2017-05-16 NOTE — Anesthesia Post-op Follow-up Note (Signed)
Anesthesia QCDR form completed.        

## 2017-05-17 ENCOUNTER — Encounter: Payer: Self-pay | Admitting: *Deleted

## 2017-05-17 NOTE — Progress Notes (Signed)
  Oncology Nurse Navigator Documentation  Navigator Location: [facility] (05/17/17 1100)   )Navigator Encounter Type: Telephone (05/17/17 1100) Telephone: Outgoing Call (05/17/17 1100)     Surgery Date: 05/16/17 (05/17/17 1100)                                            Time Spent with Patient: 15 (05/17/17 1100)   Called to follow-up with patient post surgery.  States she is doing well today.  No needs at this time.  She is to call if she has any questions or needs.

## 2017-05-22 LAB — SURGICAL PATHOLOGY

## 2017-05-24 ENCOUNTER — Inpatient Hospital Stay (INDEPENDENT_AMBULATORY_CARE_PROVIDER_SITE_OTHER): Payer: BLUE CROSS/BLUE SHIELD

## 2017-05-24 ENCOUNTER — Ambulatory Visit: Payer: BLUE CROSS/BLUE SHIELD | Admitting: General Surgery

## 2017-05-24 ENCOUNTER — Encounter: Payer: Self-pay | Admitting: General Surgery

## 2017-05-24 VITALS — BP 116/74 | HR 63 | Resp 12 | Ht 62.0 in | Wt 134.0 lb

## 2017-05-24 DIAGNOSIS — D0512 Intraductal carcinoma in situ of left breast: Secondary | ICD-10-CM | POA: Diagnosis not present

## 2017-05-24 NOTE — Progress Notes (Signed)
Patient ID: Samantha West, female   DOB: 04-Nov-1965, 52 y.o.   MRN: [id number]  Chief Complaint  Patient presents with  . Routine Post Op    HPI Samantha West is a 52 y.o. female.  Here for postoperative visit, left breast lumpectomy on 05-16-17. She states she is having some discomfort but no pain.   HPI  Past Medical History:  Diagnosis Date  . Abnormal Pap smear of cervix    -1980s hx of colposcopy and cryotherapy to cervix--paps normal since  . Anemia 2014   prior to Hysterectomy--because of fibroid  . Cancer ([place])   . Fibroid   . History of hiatal hernia    small  . Migraine    h/o migraines prior to hysterectomy    Past Surgical History:  Procedure Laterality Date  . ABDOMINAL HYSTERECTOMY  2014  . APPENDECTOMY  1986  . 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 BREAST BIOPSY Left 03/29/2017   subareolar wing clip- fibroadenoma  . BREAST BIOPSY Left 04/11/2017   9:00 ribbon clip-fibroadenoma  . BREAST CYST EXCISION Left 1982   3 cyst removed  . BREAST CYST EXCISION Right 1982   4 cyst removed  . BREAST LUMPECTOMY Left 05/16/2017   DCIS 4:00  . BREAST LUMPECTOMY Left 05/16/2017   Procedure: BREAST LUMPECTOMY;  Surgeon: Robert Bellow, MD;  Location: [facility] ORS;  Service: General;  Laterality: Left;  . CESAREAN SECTION     x 3  . COLONOSCOPY W/ BIOPSIES  12/29/2015   Tubular adenoma without Samantha West, [place],.  . CYSTOSCOPY N/A 10/08/2012   Procedure: CYSTOSCOPY;  Surgeon: Azalia Bilis, MD;  Location: [facility];  Service: Gynecology;  Laterality: N/A;  . ROBOTIC ASSISTED TOTAL HYSTERECTOMY N/A 10/08/2012   Procedure: ROBOTIC ASSISTED TOTAL HYSTERECTOMY WITH BILATERAL SALPINGECTOMY;  Surgeon: Azalia Bilis, MD;  Location: [place];  Service: Gynecology;  Laterality: N/A;  . TUBAL LIGATION      Family History  Problem Relation Age of Onset  . Hypertension Mother   . Heart failure Mother   . Stroke Mother        Dec d/t complications  of stroke at age 46  . Diabetes Sister   . Hypertension Sister   . Diabetes Paternal Grandmother   . Breast cancer Neg Hx     Social History Social History   Tobacco Use  . Smoking status: Never Smoker  . Smokeless tobacco: Never Used  Substance Use Topics  . Alcohol use: No    Alcohol/week: 0.0 oz  . Drug use: No    No Known Allergies  Current Outpatient Medications  Medication Sig Dispense Refill  . acetaminophen (TYLENOL) 500 MG tablet Take 500 mg by mouth every 6 (six) hours as needed (FOR PAIN.).    Marland Kitchen Aspirin-Salicylamide-Caffeine (BC FAST PAIN RELIEF) 650-195-33.3 MG PACK Take 1 packet by mouth daily as needed (FOR PAIN.). BC POWDERS.    . Multiple Vitamin (MULTIVITAMIN WITH MINERALS) TABS tablet Take 1 tablet by mouth 3 (three) times a week. 2-3 TIMES A WEEK    . zolpidem (AMBIEN) 5 MG tablet Take 1 tablet (5 mg total) by mouth at bedtime. Use as needed at bedtime. (Patient taking differently: Take 5 mg by mouth at bedtime. ) 30 tablet 0   No current facility-administered medications for this visit.     Review of Systems Review of Systems  Constitutional: Negative.   Respiratory: Negative.   Cardiovascular: Negative.  Blood pressure 116/74, pulse 63, resp. rate 12, height 5\' 2"  (1.575 m), weight 134 lb (60.8 kg), last menstrual period 08/25/2012, SpO2 98 %.  Physical Exam Physical Exam  Constitutional: She is oriented to person, place, and time. She appears well-developed and well-nourished.  Neck: Neck supple.  Pulmonary/Chest:  Left breast lumpectomy site clean  Lymphadenopathy:    She has no cervical adenopathy.  Neurological: She is alert and oriented to person, place, and time.  Skin: Skin is warm and dry.  Psychiatric: Her behavior is normal.    Data Reviewed May 16, 2017 pathology: A. BREAST, LEFT, LOWER OUTER QUADRANT; WIDE EXCISION:  - DUCTAL CARCINOMA IN SITU (DCIS), HIGH NUCLEAR GRADE WITH  COMEDONECROSIS.  - BIOPSY SITE CHANGES AND  MARKER CLIP PRESENT.   Comment:  There is no evidence of invasive carcinoma; all of the tissue was  submitted for histology.   There is NO ink on DCIS at the margins in these sections. The closest  margin is anterolateral, toward the inferior aspect. DCIS is 1 mm from  the red-inked anterior margin (block A7). In the same section, DCIS is 1  mm from the orange-inked lateral margin, located about 1 mm from where  the two inkcolors overlap (so this area could be considered essentially  anterior also).   BREAST BIOMARKER TEST RESULTS (DCIS):  Estrogen Receptor (ER) Status: POSITIVE, 51-90% of cells with nuclear  positivity     Average intensity of staining: Strong  Progesterone Receptor (PR) Status: POSITIVE, 1-10% of cells with nuclear  positivity     Average intensity of staining: Moderate   Ultrasound examination of the wide excision site shows no discernible pocket for balloon placement.  No images, no charge.  Assessment    Doing well post excision of high-grade DCIS.    Plan    Margin status reviewed.  All anterior which is subcutaneous fat.  No indication for reexcision.  Ultrasound examination shows no location for MammoSite balloon, patient will be best served by whole breast radiation.  She will be a candidate for antiestrogen therapy after radiation is completed.  Appointment with Radiation Oncologist for whole breast radiation  The patient is aware to use a heating pad as needed for comfort. Continue to wear support bra for comfort and as needed. Follow up in 6 weeks     HPI, Physical Exam, Assessment and Plan have been scribed under the direction and in the presence of Robert Bellow, MD. Karie Fetch, RN   Karie Fetch M 05/24/2017, 9:30 AM  I have completed the exam and reviewed the above documentation for accuracy and completeness.  I agree with the above.  Haematologist has been used and any errors in dictation or transcription are  unintentional.  Hervey Ard, M.D., F.A.C.S.  Patient has been scheduled for an appointment with Dr. Baruch Gouty at the [facility] for 05-31-17 at 11 am. A detailed voicemail was left on the patient's cell phone regarding the above information per patient's request.   The patient was placed in May 2019 recalls for follow up with Dr. Bary Castilla since schedule is not open at this time for appointments.   Dominga Ferry, CMA

## 2017-05-24 NOTE — Patient Instructions (Addendum)
The patient is aware to call back for any questions or new concerns.  Appointment with Radiation Oncologist, Dr Donella Stade ,for whole breast radiation   The patient is aware to use a heating pad as needed for comfort.  Continue to wear support bra for comfort and as needed

## 2017-05-31 ENCOUNTER — Encounter: Payer: Self-pay | Admitting: *Deleted

## 2017-05-31 ENCOUNTER — Ambulatory Visit
Admission: RE | Admit: 2017-05-31 | Discharge: 2017-05-31 | Disposition: A | Discharge status: Home / Self Care | Payer: BLUE CROSS/BLUE SHIELD | Source: Ambulatory Visit | Attending: Radiation Oncology | Admitting: Radiation Oncology

## 2017-05-31 ENCOUNTER — Other Ambulatory Visit: Payer: Self-pay

## 2017-05-31 VITALS — BP 137/85 | HR 52 | Temp 97.2°F | Resp 12 | Ht 62.0 in | Wt 135.9 lb

## 2017-05-31 DIAGNOSIS — Z79899 Other long term (current) drug therapy: Secondary | ICD-10-CM | POA: Diagnosis not present

## 2017-05-31 DIAGNOSIS — D649 Anemia, unspecified: Secondary | ICD-10-CM | POA: Diagnosis not present

## 2017-05-31 DIAGNOSIS — Z170 Estrogen receptor positive status [ER+]: Secondary | ICD-10-CM | POA: Diagnosis not present

## 2017-05-31 DIAGNOSIS — D259 Leiomyoma of uterus, unspecified: Secondary | ICD-10-CM | POA: Insufficient documentation

## 2017-05-31 DIAGNOSIS — K449 Diaphragmatic hernia without obstruction or gangrene: Secondary | ICD-10-CM | POA: Diagnosis not present

## 2017-05-31 DIAGNOSIS — D0512 Intraductal carcinoma in situ of left breast: Secondary | ICD-10-CM | POA: Diagnosis present

## 2017-05-31 NOTE — Consult Note (Signed)
NEW PATIENT EVALUATION  Name: Samantha West  MRN: [id number]  Date:   05/31/2017     DOB: 1965/05/01   This 52 y.o. 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  REFERRING PHYSICIAN: Sharyne Peach, MD  CHIEF COMPLAINT:  Chief Complaint  Patient presents with  . Breast Cancer    left    DIAGNOSIS: The encounter diagnosis was Ductal carcinoma in situ (DCIS) of left breast.   PREVIOUS INVESTIGATIONS:   Mammogram and ultrasound reviewed Clinical notes reviewed Pathology reports reviewed Case presented at breast conference  HPI: patient is a 52 year old female who presented with an abnormal mammogram of her left breast showing irregular mass in the left breast lower outer quadrant as well as circumscribed mass in the left breast slightly lower inner quadrant as well as asymmetry seen in the subareolar areolar left breast centrally.ultrasound was performed showing a \ lesion 9 o'clock position  Cm from the nipplea left breast1.5 cm in greatest dimension as well as a 4:00 3 cmfrom the nipple as well as a 0.9 cm mass in the subareolar tissue of the left breast. This was all confirmed on ultrasound. Patient underwentmultiple biopsies with the lesion at 4:00 position of the left breast showing high-grade ductal carcinoma in situ with comedonecrosis. No invasive component was seen. Patient underwent wide local excision for again high-grade grade 3 ductal carcinoma in situ with comedo necrosis. Tumor was ER/PR positive. Extent of tumor was at least 1.2 cm. Tumor was strongly ER/PR positive PR positive. The superficial margin closest the skin was close at 1 mm. Patient is seen today for radiation oncology opinion I discussed this with Dr. Tollie Pizza and no further margin based on abutting the skin was possible. She seen today and is doing well. She specifically denies breast  tenderness cough or bone pain.  PLANNED TREATMENT REGIMEN: hypofractionated left breast radiation  PAST MEDICAL HISTORY:  has a past medical history of Abnormal Pap smear of cervix, Anemia (2014), Cancer ([place]), Fibroid, History of hiatal hernia, and Migraine.    PAST SURGICAL HISTORY:  Past Surgical History:  Procedure Laterality Date  . ABDOMINAL HYSTERECTOMY  2014  . APPENDECTOMY  1986  . 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 BREAST BIOPSY Left 03/29/2017   subareolar wing clip- fibroadenoma  . BREAST BIOPSY Left 04/11/2017   9:00 ribbon clip-fibroadenoma  . BREAST CYST EXCISION Left 1982   3 cyst removed  . BREAST CYST EXCISION Right 1982   4 cyst removed  . BREAST LUMPECTOMY Left 05/16/2017   DCIS 4:00  . BREAST LUMPECTOMY Left 05/16/2017   Procedure: BREAST LUMPECTOMY;  Surgeon: Robert Bellow, MD;  Location: [facility];  Service: General;  Laterality: Left;  . CESAREAN SECTION     x 3  . COLONOSCOPY W/ BIOPSIES  12/29/2015   Tubular adenoma without Tomasa Hosteller, [place],.  . CYSTOSCOPY N/A 10/08/2012   Procedure: CYSTOSCOPY;  Surgeon: Azalia Bilis, MD;  Location: [facility];  Service: Gynecology;  Laterality: N/A;  . ROBOTIC ASSISTED TOTAL HYSTERECTOMY N/A 10/08/2012   Procedure: ROBOTIC ASSISTED TOTAL HYSTERECTOMY WITH BILATERAL SALPINGECTOMY;  Surgeon: Azalia Bilis, MD;  Location: [facility];  Service: Gynecology;  Laterality: N/A;  . TUBAL LIGATION      FAMILY HISTORY: family history includes Diabetes in her paternal grandmother and sister; Heart failure in  her mother; Hypertension in her mother and sister; Stroke in her mother.  SOCIAL HISTORY:  reports that she has never smoked. She has never used smokeless tobacco. She reports that she does not drink alcohol or use drugs.  ALLERGIES: Patient has no known allergies.  MEDICATIONS:  Current Outpatient Medications  Medication Sig Dispense Refill  . acetaminophen (TYLENOL) 500 MG tablet Take 500  mg by mouth every 6 (six) hours as needed (FOR PAIN.).    Marland Kitchen Aspirin-Salicylamide-Caffeine (BC FAST PAIN RELIEF) 650-195-33.3 MG PACK Take 1 packet by mouth daily as needed (FOR PAIN.). BC POWDERS.    . Multiple Vitamin (MULTIVITAMIN WITH MINERALS) TABS tablet Take 1 tablet by mouth 3 (three) times a week. 2-3 TIMES A WEEK    . zolpidem (AMBIEN) 5 MG tablet Take 1 tablet (5 mg total) by mouth at bedtime. Use as needed at bedtime. (Patient taking differently: Take 5 mg by mouth at bedtime. ) 30 tablet 0   No current facility-administered medications for this encounter.     ECOG PERFORMANCE STATUS:  0 - Asymptomatic  REVIEW OF SYSTEMS:  Patient denies any weight loss, fatigue, weakness, fever, chills or night sweats. Patient denies any loss of vision, blurred vision. Patient denies any ringing  of the ears or hearing loss. No irregular heartbeat. Patient denies heart murmur or history of fainting. Patient denies any chest pain or pain radiating to her upper extremities. Patient denies any shortness of breath, difficulty breathing at night, cough or hemoptysis. Patient denies any swelling in the lower legs. Patient denies any nausea vomiting, vomiting of blood, or coffee ground material in the vomitus. Patient denies any stomach pain. Patient states has had normal bowel movements no significant constipation or diarrhea. Patient denies any dysuria, hematuria or significant nocturia. Patient denies any problems walking, swelling in the joints or loss of balance. Patient denies any skin changes, loss of hair or loss of weight. Patient denies any excessive worrying or anxiety or significant depression. Patient denies any problems with insomnia. Patient denies excessive thirst, polyuria, polydipsia. Patient denies any swollen glands, patient denies easy bruising or easy bleeding. Patient denies any recent infections, allergies or URI. Patient "s visual fields have not changed significantly in recent time.     PHYSICAL EXAM: BP 137/85 (BP Location: Right Arm, Patient Position: Sitting, Cuff Size: Normal)   Pulse (!) 52   Temp (!) 97.2 F (36.2 C) (Tympanic)   Resp 12   Ht 5\' 2"  (1.575 m)   Wt 135 lb 14.6 oz (61.7 kg)   LMP 08/25/2012   BMI 24.86 kg/m  Patient is status post wide local excision of the left breast. Incision is well-healed no dominant mass or nodularity is noted in either breast in 2 positions examined. No axillary or supraclavicular adenopathy is noted. Well-developed well-nourished patient in NAD. HEENT reveals PERLA, EOMI, discs not visualized.  Oral cavity is clear. No oral mucosal lesions are identified. Neck is clear without evidence of cervical or supraclavicular adenopathy. Lungs are clear to A&P. Cardiac examination is essentially unremarkable with regular rate and rhythm without murmur rub or thrill. Abdomen is benign with no organomegaly or masses noted. Motor sensory and DTR levels are equal and symmetric in the upper and lower extremities. Cranial nerves II through XII are grossly intact. Proprioception is intact. No peripheral adenopathy or edema is identified. No motor or sensory levels are noted. Crude visual fields are within normal range.  LABORATORY DATA: pathology reports reviewed and compatible above-stated findings  RADIOLOGY RESULTS:mammograms and ultrasound reviewed   IMPRESSION: ductal carcinoma in situ ER/PR positive in the left breast status post wide local excision in 52 year old female  PLAN: at this time I have recommended whole breast radiation. I believe she can have hypofractionated course of treatment over 4 weeks. Based on the close margin will also boost her scar another 1600 cGy using electron beam. Risks and benefits of treatment including skin reaction fatigue alteration of blood counts possible inclusion of superficial lungall were discussed in detail with the patient. She seems to comprehend her treatment plan well. I personally set up and  ordered CT simulation for early next week. She also will be candidate for antiestrogen therapy after completion of radiation. Patient seems to compress my treatment plan well.  I would like to take this opportunity to thank you for allowing me to participate in the care of your patient.Noreene Filbert, MD

## 2017-05-31 NOTE — Progress Notes (Signed)
  Oncology Nurse Navigator Documentation  Navigator Location: [facility] (05/31/17 1100)   )Navigator Encounter Type: Initial RadOnc (05/31/17 1100)                   Treatment Initiated Date: 05/16/17 (05/31/17 1100) Patient Visit Type: RadOnc (05/31/17 1100)   Barriers/Navigation Needs: No Needs (05/31/17 1100)                          Time Spent with Patient: 60 (05/31/17 1100)   Met patient during her initial radiation oncology consult with Dr. Baruch Gouty.  Offered support.  No needs at this time.  She is to call if she has any questions or needs.

## 2017-06-04 ENCOUNTER — Ambulatory Visit
Admission: RE | Admit: 2017-06-04 | Discharge: 2017-06-04 | Disposition: A | Discharge status: Home / Self Care | Payer: BLUE CROSS/BLUE SHIELD | Source: Ambulatory Visit | Attending: Radiation Oncology | Admitting: Radiation Oncology

## 2017-06-04 DIAGNOSIS — D0512 Intraductal carcinoma in situ of left breast: Secondary | ICD-10-CM | POA: Diagnosis present

## 2017-06-04 DIAGNOSIS — Z170 Estrogen receptor positive status [ER+]: Secondary | ICD-10-CM | POA: Insufficient documentation

## 2017-06-04 DIAGNOSIS — Z510 Encounter for antineoplastic radiation therapy: Secondary | ICD-10-CM | POA: Diagnosis not present

## 2017-06-06 DIAGNOSIS — D0512 Intraductal carcinoma in situ of left breast: Secondary | ICD-10-CM | POA: Diagnosis not present

## 2017-06-08 ENCOUNTER — Other Ambulatory Visit: Payer: Self-pay | Admitting: *Deleted

## 2017-06-08 DIAGNOSIS — D0512 Intraductal carcinoma in situ of left breast: Secondary | ICD-10-CM

## 2017-06-12 ENCOUNTER — Ambulatory Visit
Admission: RE | Admit: 2017-06-12 | Discharge: 2017-06-12 | Disposition: A | Discharge status: Home / Self Care | Payer: BLUE CROSS/BLUE SHIELD | Source: Ambulatory Visit | Attending: Radiation Oncology | Admitting: Radiation Oncology

## 2017-06-12 DIAGNOSIS — D0512 Intraductal carcinoma in situ of left breast: Secondary | ICD-10-CM | POA: Diagnosis not present

## 2017-06-13 ENCOUNTER — Ambulatory Visit
Admission: RE | Admit: 2017-06-13 | Discharge: 2017-06-13 | Disposition: A | Discharge status: Home / Self Care | Payer: BLUE CROSS/BLUE SHIELD | Source: Ambulatory Visit | Attending: Radiation Oncology | Admitting: Radiation Oncology

## 2017-06-13 DIAGNOSIS — D0512 Intraductal carcinoma in situ of left breast: Secondary | ICD-10-CM | POA: Diagnosis not present

## 2017-06-14 ENCOUNTER — Ambulatory Visit
Admission: RE | Admit: 2017-06-14 | Discharge: 2017-06-14 | Disposition: A | Discharge status: Home / Self Care | Payer: BLUE CROSS/BLUE SHIELD | Source: Ambulatory Visit | Attending: Radiation Oncology | Admitting: Radiation Oncology

## 2017-06-14 DIAGNOSIS — D0512 Intraductal carcinoma in situ of left breast: Secondary | ICD-10-CM | POA: Diagnosis not present

## 2017-06-15 ENCOUNTER — Ambulatory Visit
Admission: RE | Admit: 2017-06-15 | Discharge: 2017-06-15 | Disposition: A | Discharge status: Home / Self Care | Payer: BLUE CROSS/BLUE SHIELD | Source: Ambulatory Visit | Attending: Radiation Oncology | Admitting: Radiation Oncology

## 2017-06-15 DIAGNOSIS — D0512 Intraductal carcinoma in situ of left breast: Secondary | ICD-10-CM | POA: Diagnosis not present

## 2017-06-18 ENCOUNTER — Ambulatory Visit
Admission: RE | Admit: 2017-06-18 | Discharge: 2017-06-18 | Disposition: A | Discharge status: Home / Self Care | Payer: BLUE CROSS/BLUE SHIELD | Source: Ambulatory Visit | Attending: Radiation Oncology | Admitting: Radiation Oncology

## 2017-06-18 DIAGNOSIS — D0512 Intraductal carcinoma in situ of left breast: Secondary | ICD-10-CM | POA: Diagnosis not present

## 2017-06-19 ENCOUNTER — Ambulatory Visit
Admission: RE | Admit: 2017-06-19 | Discharge: 2017-06-19 | Disposition: A | Discharge status: Home / Self Care | Payer: BLUE CROSS/BLUE SHIELD | Source: Ambulatory Visit | Attending: Radiation Oncology | Admitting: Radiation Oncology

## 2017-06-19 DIAGNOSIS — D0512 Intraductal carcinoma in situ of left breast: Secondary | ICD-10-CM | POA: Diagnosis not present

## 2017-06-20 ENCOUNTER — Ambulatory Visit
Admission: RE | Admit: 2017-06-20 | Discharge: 2017-06-20 | Disposition: A | Discharge status: Home / Self Care | Payer: BLUE CROSS/BLUE SHIELD | Source: Ambulatory Visit | Attending: Radiation Oncology | Admitting: Radiation Oncology

## 2017-06-20 DIAGNOSIS — D0512 Intraductal carcinoma in situ of left breast: Secondary | ICD-10-CM | POA: Diagnosis not present

## 2017-06-21 ENCOUNTER — Ambulatory Visit
Admission: RE | Admit: 2017-06-21 | Discharge: 2017-06-21 | Disposition: A | Discharge status: Home / Self Care | Payer: BLUE CROSS/BLUE SHIELD | Source: Ambulatory Visit | Attending: Radiation Oncology | Admitting: Radiation Oncology

## 2017-06-21 DIAGNOSIS — D0512 Intraductal carcinoma in situ of left breast: Secondary | ICD-10-CM | POA: Diagnosis not present

## 2017-06-22 ENCOUNTER — Ambulatory Visit
Admission: RE | Admit: 2017-06-22 | Discharge: 2017-06-22 | Disposition: A | Discharge status: Home / Self Care | Payer: BLUE CROSS/BLUE SHIELD | Source: Ambulatory Visit | Attending: Radiation Oncology | Admitting: Radiation Oncology

## 2017-06-22 DIAGNOSIS — D0512 Intraductal carcinoma in situ of left breast: Secondary | ICD-10-CM | POA: Diagnosis not present

## 2017-06-25 ENCOUNTER — Ambulatory Visit
Admission: RE | Admit: 2017-06-25 | Discharge: 2017-06-25 | Disposition: A | Discharge status: Home / Self Care | Payer: BLUE CROSS/BLUE SHIELD | Source: Ambulatory Visit | Attending: Radiation Oncology | Admitting: Radiation Oncology

## 2017-06-25 DIAGNOSIS — D0512 Intraductal carcinoma in situ of left breast: Secondary | ICD-10-CM | POA: Diagnosis not present

## 2017-06-26 ENCOUNTER — Inpatient Hospital Stay: Payer: BLUE CROSS/BLUE SHIELD | Attending: Radiation Oncology

## 2017-06-26 ENCOUNTER — Ambulatory Visit
Admission: RE | Admit: 2017-06-26 | Discharge: 2017-06-26 | Disposition: A | Discharge status: Home / Self Care | Payer: BLUE CROSS/BLUE SHIELD | Source: Ambulatory Visit | Attending: Radiation Oncology | Admitting: Radiation Oncology

## 2017-06-26 DIAGNOSIS — D0512 Intraductal carcinoma in situ of left breast: Secondary | ICD-10-CM | POA: Diagnosis not present

## 2017-06-27 ENCOUNTER — Ambulatory Visit
Admission: RE | Admit: 2017-06-27 | Discharge: 2017-06-27 | Disposition: A | Discharge status: Home / Self Care | Payer: BLUE CROSS/BLUE SHIELD | Source: Ambulatory Visit | Attending: Radiation Oncology | Admitting: Radiation Oncology

## 2017-06-27 ENCOUNTER — Inpatient Hospital Stay: Payer: BLUE CROSS/BLUE SHIELD

## 2017-06-27 DIAGNOSIS — D0512 Intraductal carcinoma in situ of left breast: Secondary | ICD-10-CM | POA: Diagnosis not present

## 2017-06-28 ENCOUNTER — Ambulatory Visit
Admission: RE | Admit: 2017-06-28 | Discharge: 2017-06-28 | Disposition: A | Discharge status: Home / Self Care | Payer: BLUE CROSS/BLUE SHIELD | Source: Ambulatory Visit | Attending: Radiation Oncology | Admitting: Radiation Oncology

## 2017-06-28 DIAGNOSIS — D0512 Intraductal carcinoma in situ of left breast: Secondary | ICD-10-CM | POA: Diagnosis not present

## 2017-06-29 ENCOUNTER — Ambulatory Visit
Admission: RE | Admit: 2017-06-29 | Discharge: 2017-06-29 | Disposition: A | Discharge status: Home / Self Care | Payer: BLUE CROSS/BLUE SHIELD | Source: Ambulatory Visit | Attending: Radiation Oncology | Admitting: Radiation Oncology

## 2017-06-29 DIAGNOSIS — D0512 Intraductal carcinoma in situ of left breast: Secondary | ICD-10-CM | POA: Diagnosis not present

## 2017-07-02 ENCOUNTER — Ambulatory Visit
Admission: RE | Admit: 2017-07-02 | Discharge: 2017-07-02 | Disposition: A | Discharge status: Home / Self Care | Payer: BLUE CROSS/BLUE SHIELD | Source: Ambulatory Visit | Attending: Radiation Oncology | Admitting: Radiation Oncology

## 2017-07-02 DIAGNOSIS — D0512 Intraductal carcinoma in situ of left breast: Secondary | ICD-10-CM | POA: Diagnosis not present

## 2017-07-03 ENCOUNTER — Ambulatory Visit
Admission: RE | Admit: 2017-07-03 | Discharge: 2017-07-03 | Disposition: A | Discharge status: Home / Self Care | Payer: BLUE CROSS/BLUE SHIELD | Source: Ambulatory Visit | Attending: Radiation Oncology | Admitting: Radiation Oncology

## 2017-07-03 DIAGNOSIS — D0512 Intraductal carcinoma in situ of left breast: Secondary | ICD-10-CM | POA: Diagnosis not present

## 2017-07-04 ENCOUNTER — Ambulatory Visit
Admission: RE | Admit: 2017-07-04 | Discharge: 2017-07-04 | Disposition: A | Discharge status: Home / Self Care | Payer: BLUE CROSS/BLUE SHIELD | Source: Ambulatory Visit | Attending: Radiation Oncology | Admitting: Radiation Oncology

## 2017-07-04 DIAGNOSIS — D0512 Intraductal carcinoma in situ of left breast: Secondary | ICD-10-CM | POA: Diagnosis present

## 2017-07-04 DIAGNOSIS — Z510 Encounter for antineoplastic radiation therapy: Secondary | ICD-10-CM | POA: Diagnosis not present

## 2017-07-04 DIAGNOSIS — Z170 Estrogen receptor positive status [ER+]: Secondary | ICD-10-CM | POA: Diagnosis not present

## 2017-07-05 ENCOUNTER — Ambulatory Visit
Admission: RE | Admit: 2017-07-05 | Discharge: 2017-07-05 | Disposition: A | Discharge status: Home / Self Care | Payer: BLUE CROSS/BLUE SHIELD | Source: Ambulatory Visit | Attending: Radiation Oncology | Admitting: Radiation Oncology

## 2017-07-05 DIAGNOSIS — Z510 Encounter for antineoplastic radiation therapy: Secondary | ICD-10-CM | POA: Diagnosis not present

## 2017-07-06 ENCOUNTER — Ambulatory Visit
Admission: RE | Admit: 2017-07-06 | Discharge: 2017-07-06 | Disposition: A | Discharge status: Home / Self Care | Payer: BLUE CROSS/BLUE SHIELD | Source: Ambulatory Visit | Attending: Radiation Oncology | Admitting: Radiation Oncology

## 2017-07-06 DIAGNOSIS — Z510 Encounter for antineoplastic radiation therapy: Secondary | ICD-10-CM | POA: Diagnosis not present

## 2017-07-09 ENCOUNTER — Ambulatory Visit
Admission: RE | Admit: 2017-07-09 | Discharge: 2017-07-09 | Disposition: A | Discharge status: Home / Self Care | Payer: BLUE CROSS/BLUE SHIELD | Source: Ambulatory Visit | Attending: Radiation Oncology | Admitting: Radiation Oncology

## 2017-07-09 DIAGNOSIS — Z510 Encounter for antineoplastic radiation therapy: Secondary | ICD-10-CM | POA: Diagnosis not present

## 2017-07-10 ENCOUNTER — Ambulatory Visit
Admission: RE | Admit: 2017-07-10 | Discharge: 2017-07-10 | Disposition: A | Discharge status: Home / Self Care | Payer: BLUE CROSS/BLUE SHIELD | Source: Ambulatory Visit | Attending: Radiation Oncology | Admitting: Radiation Oncology

## 2017-07-10 ENCOUNTER — Inpatient Hospital Stay: Payer: BLUE CROSS/BLUE SHIELD | Attending: Radiation Oncology

## 2017-07-10 DIAGNOSIS — Z510 Encounter for antineoplastic radiation therapy: Secondary | ICD-10-CM | POA: Diagnosis not present

## 2017-07-11 ENCOUNTER — Ambulatory Visit
Admission: RE | Admit: 2017-07-11 | Discharge: 2017-07-11 | Disposition: A | Discharge status: Home / Self Care | Payer: BLUE CROSS/BLUE SHIELD | Source: Ambulatory Visit | Attending: Radiation Oncology | Admitting: Radiation Oncology

## 2017-07-11 DIAGNOSIS — Z510 Encounter for antineoplastic radiation therapy: Secondary | ICD-10-CM | POA: Diagnosis not present

## 2017-07-12 ENCOUNTER — Ambulatory Visit
Admission: RE | Admit: 2017-07-12 | Discharge: 2017-07-12 | Disposition: A | Discharge status: Home / Self Care | Payer: BLUE CROSS/BLUE SHIELD | Source: Ambulatory Visit | Attending: Radiation Oncology | Admitting: Radiation Oncology

## 2017-07-12 DIAGNOSIS — Z510 Encounter for antineoplastic radiation therapy: Secondary | ICD-10-CM | POA: Diagnosis not present

## 2017-07-13 ENCOUNTER — Ambulatory Visit
Admission: RE | Admit: 2017-07-13 | Discharge: 2017-07-13 | Disposition: A | Discharge status: Home / Self Care | Payer: BLUE CROSS/BLUE SHIELD | Source: Ambulatory Visit | Attending: Radiation Oncology | Admitting: Radiation Oncology

## 2017-07-13 DIAGNOSIS — Z510 Encounter for antineoplastic radiation therapy: Secondary | ICD-10-CM | POA: Diagnosis not present

## 2017-07-16 ENCOUNTER — Ambulatory Visit
Admission: RE | Admit: 2017-07-16 | Discharge: 2017-07-16 | Disposition: A | Discharge status: Home / Self Care | Payer: BLUE CROSS/BLUE SHIELD | Source: Ambulatory Visit | Attending: Radiation Oncology | Admitting: Radiation Oncology

## 2017-07-16 DIAGNOSIS — Z510 Encounter for antineoplastic radiation therapy: Secondary | ICD-10-CM | POA: Diagnosis not present

## 2017-07-17 ENCOUNTER — Ambulatory Visit (INDEPENDENT_AMBULATORY_CARE_PROVIDER_SITE_OTHER): Payer: BLUE CROSS/BLUE SHIELD | Admitting: General Surgery

## 2017-07-17 ENCOUNTER — Encounter: Payer: Self-pay | Admitting: General Surgery

## 2017-07-17 VITALS — BP 120/74 | HR 76 | Resp 12 | Ht 62.0 in | Wt 135.0 lb

## 2017-07-17 DIAGNOSIS — D0512 Intraductal carcinoma in situ of left breast: Secondary | ICD-10-CM

## 2017-07-17 MED ORDER — TAMOXIFEN CITRATE 20 MG PO TABS: 20.0000 mg | ORAL_TABLET | Freq: Every day | ORAL | 12 refills | Status: DC

## 2017-07-17 NOTE — Patient Instructions (Addendum)
Patient to call and report in one month. Patient to take a baby aspirin daily. Return in four months.

## 2017-07-17 NOTE — Progress Notes (Signed)
Patient ID: Samantha West, female   DOB: [birth year], 52 y.o.   MRN: [id number]  Chief Complaint  Patient presents with  . Follow-up    HPI Samantha West is a 52 y.o. female here today for her follow up left breat cancer. Left lumpectomy done on 05/16/2017. Patient states she is doing well. Patient finished radiation yesterday.  HPI  Past Medical History:  Diagnosis Date  . Abnormal Pap smear of cervix    -1980s hx of colposcopy and cryotherapy to cervix--paps normal since  . Anemia 2014   prior to Hysterectomy--because of fibroid  . Cancer ([place]antha West)   . Fibroid   . History of hiatal hernia    small  . Migraine    h/o migraines prior to hysterectomy    Past Surgical History:  Procedure Laterality Date  . ABDOMINAL HYSTERECTOMY  2014  . APPENDECTOMY  1986  . 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 BREAST BIOPSY Left 03/29/2017   subareolar wing clip- fibroadenoma  . BREAST BIOPSY Left 04/11/2017   9:00 ribbon clip-fibroadenoma  . BREAST CYST EXCISION Left 1982   3 cyst removed  . BREAST CYST EXCISION Right 1982   4 cyst removed  . BREAST LUMPECTOMY Left 05/16/2017   DCIS 4:00  . BREAST LUMPECTOMY Left 05/16/2017   Procedure: BREAST LUMPECTOMY;  Surgeon: Samantha Bellow, MD;  Location: [facility];  Service: General;  Laterality: Left;  . CESAREAN SECTION     x 3  . COLONOSCOPY W/ BIOPSIES  12/29/2015   Tubular adenoma without Samantha West, [place],.  . CYSTOSCOPY N/A 10/08/2012   Procedure: CYSTOSCOPY;  Surgeon: Samantha Bilis, MD;  Location: [facility];  Service: Gynecology;  Laterality: N/A;  . ROBOTIC ASSISTED TOTAL HYSTERECTOMY N/A 10/08/2012   Procedure: ROBOTIC ASSISTED TOTAL HYSTERECTOMY WITH BILATERAL SALPINGECTOMY;  Surgeon: Samantha Bilis, MD;  Location: [facility];  Service: Gynecology;  Laterality: N/A;  . TUBAL LIGATION      Family History  Problem Relation Age of Onset  . Hypertension Mother   . Heart failure Mother   . Stroke Mother      Dec d/t complications of stroke at age 24  . Diabetes Sister   . Hypertension Sister   . Diabetes Paternal Grandmother   . Breast cancer Neg Hx     Social History Social History   Tobacco Use  . Smoking status: Never Smoker  . Smokeless tobacco: Never Used  Substance Use Topics  . Alcohol use: No    Alcohol/week: 0.0 oz  . Drug use: No    No Known Allergies  Current Outpatient Medications  Medication Sig Dispense Refill  . acetaminophen (TYLENOL) 500 MG tablet Take 500 mg by mouth every 6 (six) hours as needed (FOR PAIN.).    Marland Kitchen Aspirin-Salicylamide-Caffeine (BC FAST PAIN RELIEF) 650-195-33.3 MG PACK Take 1 packet by mouth daily as needed (FOR PAIN.). BC POWDERS.    . Multiple Vitamin (MULTIVITAMIN WITH MINERALS) TABS tablet Take 1 tablet by mouth 3 (three) times a week. 2-3 TIMES A WEEK    . zolpidem (AMBIEN) 5 MG tablet Take 1 tablet (5 mg total) by mouth at bedtime. Use as needed at bedtime. (Patient taking differently: Take 5 mg by mouth at bedtime. ) 30 tablet 0  . tamoxifen (NOLVADEX) 20 MG tablet Take 1 tablet (20 mg total) by mouth daily. 30 tablet 12   No current facility-administered medications for this visit.     Review  of Systems Review of Systems  Constitutional: Negative.   Respiratory: Negative.   Cardiovascular: Negative.     Blood pressure 120/74, pulse 76, resp. rate 12, height 5\' 2"  (1.575 m), weight 135 lb (61.2 kg), last menstrual period 08/25/2012.  Physical Exam Physical Exam  Constitutional: She is oriented to person, place, and time. She appears well-developed and well-nourished.  Eyes: Conjunctivae are normal. No scleral icterus.  Neck: Neck supple.  Cardiovascular: Normal rate, regular rhythm and normal heart sounds.  Pulmonary/Chest: Effort normal and breath sounds normal. Right breast exhibits no inverted nipple, no mass, no nipple discharge, no skin change and no tenderness. Left breast exhibits no inverted nipple, no mass, no nipple  discharge, no skin change and no tenderness.    Lymphadenopathy:    She has no cervical adenopathy.    She has no axillary adenopathy.  Neurological: She is alert and oriented to person, place, and time.  Skin: Skin is warm and dry.    Data Reviewed DCIS/ER positive.  Assessment    Doing well since completion of whole breast radiation.    Plan  Indications for antiestrogen therapy reviewed.  Risks associated with the use of tamoxifen including DVT and vasomotor symptoms reviewed.  The patient is amenable to a trial of tamoxifen.  She is been strongly encouraged to plan on taking it for 1 month to see how her overall responses.  Patient to call and report in one month.  Patient to take a baby aspirin daily. Return in four months.  Rx sent.    HPI, Physical Exam, Assessment and Plan have been scribed under the direction and in the presence of Samantha Ard, MD.  Gaspar Cola, Samantha West  I have completed the exam and reviewed the above documentation for accuracy and completeness.  I agree with the above.  Haematologist has been used and any errors in dictation or transcription are unintentional.  Samantha West, M.D., F.A.C.S.   Samantha West 07/17/2017, 8:50 PM

## 2017-08-21 ENCOUNTER — Telehealth: Payer: Self-pay | Admitting: *Deleted

## 2017-08-21 ENCOUNTER — Encounter: Payer: Self-pay | Admitting: Radiation Oncology

## 2017-08-21 ENCOUNTER — Ambulatory Visit
Admission: RE | Admit: 2017-08-21 | Discharge: 2017-08-21 | Disposition: A | Discharge status: Home / Self Care | Payer: BLUE CROSS/BLUE SHIELD | Source: Ambulatory Visit | Attending: Radiation Oncology | Admitting: Radiation Oncology

## 2017-08-21 ENCOUNTER — Other Ambulatory Visit: Payer: Self-pay

## 2017-08-21 VITALS — BP 124/81 | HR 71 | Temp 97.4°F | Resp 20 | Wt 137.9 lb

## 2017-08-21 DIAGNOSIS — Z923 Personal history of irradiation: Secondary | ICD-10-CM | POA: Diagnosis not present

## 2017-08-21 DIAGNOSIS — D0512 Intraductal carcinoma in situ of left breast: Secondary | ICD-10-CM | POA: Diagnosis not present

## 2017-08-21 DIAGNOSIS — Z170 Estrogen receptor positive status [ER+]: Secondary | ICD-10-CM | POA: Diagnosis not present

## 2017-08-21 NOTE — Telephone Encounter (Signed)
She called with an update regarding her tamoxifen therapy. She states that the only effects she has noticed is fatigue, which she states could be the radiation effects as well. She also wanted to know at what age her daughters should start getting their screening mammograms?

## 2017-08-21 NOTE — Progress Notes (Signed)
Radiation Oncology Follow up Note  Name: Samantha West   Date:   08/21/2017 MRN:  [id number] DOB: [birth year]    This 52 y.o. female presents to the clinic today for ne-month follow-up status post whole breast radiation to her left breast for stage 0 ductal carcinoma in situ ER/PR positive.  REFERRING PROVIDER: Sharyne Peach, MD  HPI: patient is a 52 year old female now out 1 month having completed external beam radiation therapy to her left breast for ER/PR positive ductal carcinoma in situ status post wide local excision. Seen today in routine follow-up she is doing well. She specifically denies breast tenderness cough or bone pain..she has been started on tamoxifen and is tolerating that well without side effect.she does have some minor fatigue.  COMPLICATIONS OF TREATMENT: none  FOLLOW UP COMPLIANCE: keeps appointments   PHYSICAL EXAM:  BP 124/81   Pulse 71   Temp (!) 97.4 F (36.3 C)   Resp 20   Wt 137 lb 14.4 oz (62.5 kg)   LMP 08/25/2012   BMI 25.22 kg/m  Lungs are clear to A&P cardiac examination essentially unremarkable with regular rate and rhythm. No dominant mass or nodularity is noted in either breast in 2 positions examined. Incision is well-healed. No axillary or supraclavicular adenopathy is appreciated. Cosmetic result is excellent. Well-developed well-nourished patient in NAD. HEENT reveals PERLA, EOMI, discs not visualized.  Oral cavity is clear. No oral mucosal lesions are identified. Neck is clear without evidence of cervical or supraclavicular adenopathy. Lungs are clear to A&P. Cardiac examination is essentially unremarkable with regular rate and rhythm without murmur rub or thrill. Abdomen is benign with no organomegaly or masses noted. Motor sensory and DTR levels are equal and symmetric in the upper and lower extremities. Cranial nerves II through XII are grossly intact. Proprioception is intact. No peripheral adenopathy or edema is identified. No motor or  sensory levels are noted. Crude visual fields are within normal range.  RADIOLOGY RESULTS: no current films for review  PLAN: at the present time patient is doing well recovering nicely 1 month out. She continues to be slightly fatigued. I'm please were overall progress. She continues on tamoxifen without side effect. I've asked to see her back in 4-5 months for follow-up. She did have questions today about genetic testing which I do not feel based on family history or tumor type would be necessary although I've asked her to talk that over with Dr. Tollie Pizza.he otherwise continues to do well. Patient is to call with any concerns.  I would like to take this opportunity to thank you for allowing me to participate in the care of your patient.Noreene Filbert, MD

## 2017-08-22 ENCOUNTER — Ambulatory Visit: Payer: BLUE CROSS/BLUE SHIELD | Admitting: Radiation Oncology

## 2017-08-22 NOTE — Telephone Encounter (Signed)
Notified that daughters should begin mammograms at age 53. Continue to monitor for side effects of tamoxifen.

## 2017-11-13 ENCOUNTER — Ambulatory Visit: Payer: BLUE CROSS/BLUE SHIELD | Admitting: General Surgery

## 2017-11-13 VITALS — BP 110/70 | HR 65 | Ht 62.0 in | Wt 136.4 lb

## 2017-11-13 DIAGNOSIS — D0512 Intraductal carcinoma in situ of left breast: Secondary | ICD-10-CM

## 2017-11-13 NOTE — Patient Instructions (Addendum)
The patient has been asked to return to the office in six months  with a bilateral diagnostic mammogram.The patient is aware to call back for any questions or concerns. Lipoma A lipoma is a noncancerous (benign) tumor that is made up of fat cells. This is a very common type of soft-tissue growth. Lipomas are usually found under the skin (subcutaneous). They may occur in any tissue of the body that contains fat. Common areas for lipomas to appear include the back, shoulders, buttocks, and thighs. Lipomas grow slowly, and they are usually painless. Most lipomas do not cause problems and do not require treatment. What are the causes? The cause of this condition is not known. What increases the risk? This condition is more likely to develop in:  People who are 64-31 years old.  People who have a family history of lipomas.  What are the signs or symptoms? A lipoma usually appears as a small, round bump under the skin. It may feel soft or rubbery, but the firmness can vary. Most lipomas are not painful. However, a lipoma may become painful if it is located in an area where it pushes on nerves. How is this diagnosed? A lipoma can usually be diagnosed with a physical exam. You may also have tests to confirm the diagnosis and to rule out other conditions. Tests may include:  Imaging tests, such as a CT scan or MRI.  Removal of a tissue sample to be looked at under a microscope (biopsy).  How is this treated? Treatment is not needed for small lipomas that are not causing problems. If a lipoma continues to get bigger or it causes problems, removal is often the best option. Lipomas can also be removed to improve appearance. Removal of a lipoma is usually done with a surgery in which the fatty cells and the surrounding capsule are removed. Most often, a medicine that numbs the area (local anesthetic) is used for this procedure. Follow these instructions at home:  Keep all follow-up visits as directed by  your health care provider. This is important. Contact a health care provider if:  Your lipoma becomes larger or hard.  Your lipoma becomes painful, red, or increasingly swollen. These could be signs of infection or a more serious condition. This information is not intended to replace advice given to you by your health care provider. Make sure you discuss any questions you have with your health care provider. Document Released: 02/10/2002 Document Revised: 07/29/2015 Document Reviewed: 02/16/2014 Elsevier Interactive Patient Education  [employer].

## 2017-11-13 NOTE — Progress Notes (Signed)
Patient ID: Samantha West, female   DOB: 12-14-1965, 52 y.o.   MRN: [id number]  Chief Complaint  Patient presents with  . Breast Cancer     4 mo f/u rec Breast Ca    HPI Samantha West is a 52 y.o. female here today for her follow up breast cancer, no mammograms.Patient states she is feeling well. Patient has a 5cm lipoma on back left side located by her scapula.  HPI  Past Medical History:  Diagnosis Date  . Abnormal Pap smear of cervix    -1980s hx of colposcopy and cryotherapy to cervix--paps normal since  . Anemia 2014   prior to Hysterectomy--because of fibroid  . Cancer ([place])   . Fibroid   . History of hiatal hernia    small  . Migraine    h/o migraines prior to hysterectomy    Past Surgical History:  Procedure Laterality Date  . ABDOMINAL HYSTERECTOMY  2014  . APPENDECTOMY  1986  . 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 BREAST BIOPSY Left 03/29/2017   subareolar wing clip- fibroadenoma  . BREAST BIOPSY Left 04/11/2017   9:00 ribbon clip-fibroadenoma  . BREAST CYST EXCISION Left 1982   3 cyst removed  . BREAST CYST EXCISION Right 1982   4 cyst removed  . BREAST LUMPECTOMY Left 05/16/2017   DCIS 4:00  . BREAST LUMPECTOMY Left 05/16/2017   Procedure: BREAST LUMPECTOMY;  Surgeon: Robert Bellow, MD;  Location: [facility];  Service: General;  Laterality: Left;  . CESAREAN SECTION     x 3  . COLONOSCOPY W/ BIOPSIES  12/29/2015   Tubular adenoma without Tomasa Hosteller, [place],.  . CYSTOSCOPY N/A 10/08/2012   Procedure: CYSTOSCOPY;  Surgeon: Azalia Bilis, MD;  Location: [facility];  Service: Gynecology;  Laterality: N/A;  . ROBOTIC ASSISTED TOTAL HYSTERECTOMY N/A 10/08/2012   Procedure: ROBOTIC ASSISTED TOTAL HYSTERECTOMY WITH BILATERAL SALPINGECTOMY;  Surgeon: Azalia Bilis, MD;  Location: [facility];  Service: Gynecology;  Laterality: N/A;  . TUBAL LIGATION      Family History  Problem Relation Age of Onset  . Hypertension Mother   . Heart  failure Mother   . Stroke Mother        Dec d/t complications of stroke at age 22  . Diabetes Sister   . Hypertension Sister   . Diabetes Paternal Grandmother   . Breast cancer Neg Hx     Social History Social History   Tobacco Use  . Smoking status: Never Smoker  . Smokeless tobacco: Never Used  Substance Use Topics  . Alcohol use: No    Alcohol/week: 0.0 standard drinks  . Drug use: No    No Known Allergies  Current Outpatient Medications  Medication Sig Dispense Refill  . acetaminophen (TYLENOL) 500 MG tablet Take 500 mg by mouth every 6 (six) hours as needed (FOR PAIN.).    Marland Kitchen aspirin EC 81 MG tablet Take 81 mg by mouth daily.    . Aspirin-Salicylamide-Caffeine (BC FAST PAIN RELIEF) 650-195-33.3 MG PACK Take 1 packet by mouth daily as needed (FOR PAIN.). BC POWDERS.    Marland Kitchen doxepin (SINEQUAN) 10 MG capsule TAKE 1 CAPSULE BY MOUTH EVERY DAY IN THE EVENING  2  . Multiple Vitamin (MULTIVITAMIN WITH MINERALS) TABS tablet Take 1 tablet by mouth 3 (three) times a week. 2-3 TIMES A WEEK    . tamoxifen (NOLVADEX) 20 MG tablet Take 1 tablet (20 mg total) by mouth daily.  30 tablet 12  . zolpidem (AMBIEN) 5 MG tablet Take 1 tablet (5 mg total) by mouth at bedtime. Use as needed at bedtime. (Patient taking differently: Take 5 mg by mouth at bedtime. ) 30 tablet 0   No current facility-administered medications for this visit.     Review of Systems Review of Systems  Constitutional: Negative.   Respiratory: Negative.   Cardiovascular: Negative.     Blood pressure 110/70, pulse 65, height 5\' 2"  (1.575 m), weight 136 lb 6.4 oz (61.9 kg), last menstrual period 08/25/2012, SpO2 97 %.  Physical Exam Physical Exam  Constitutional: She is oriented to person, place, and time. She appears well-developed and well-nourished.  Eyes: Conjunctivae are normal. No scleral icterus.  Neck: Normal range of motion.  Cardiovascular: Normal rate, regular rhythm and normal heart sounds.   Pulmonary/Chest: Effort normal and breath sounds normal. Right breast exhibits no inverted nipple, no mass, no nipple discharge, no skin change and no tenderness. Left breast exhibits no inverted nipple, no mass, no nipple discharge, no skin change and no tenderness. Breasts are symmetrical.    Lymphadenopathy:    She has no cervical adenopathy.  Neurological: She is alert and oriented to person, place, and time.  Skin: Skin is warm and dry.    Data Reviewed Pathology showed DCIS.  Assessment    Doing well post wide excision and subsequent spinal whole breast radiation.    Plan   The patient has been asked to return to the office in six months  with a bilateral diagnostic mammogram.The patient is aware to call back for any questions or concerns.   HPI, Physical Exam, Assessment and Plan have been scribed under the direction and in the presence of Hervey Ard, MD.  Samantha West, CMA  I have completed the exam and reviewed the above documentation for accuracy and completeness.  I agree with the above.  Haematologist has been used and any errors in dictation or transcription are unintentional.  Hervey Ard, M.D., F.A.C.S.  Samantha West 11/15/2017, 7:43 AM

## 2018-01-21 ENCOUNTER — Other Ambulatory Visit: Payer: Self-pay

## 2018-01-21 ENCOUNTER — Ambulatory Visit (INDEPENDENT_AMBULATORY_CARE_PROVIDER_SITE_OTHER): Payer: BLUE CROSS/BLUE SHIELD | Admitting: Obstetrics and Gynecology

## 2018-01-21 ENCOUNTER — Encounter: Payer: Self-pay | Admitting: Obstetrics and Gynecology

## 2018-01-21 VITALS — BP 100/72 | HR 64 | Ht 62.0 in | Wt 137.8 lb

## 2018-01-21 DIAGNOSIS — N6320 Unspecified lump in the left breast, unspecified quadrant: Secondary | ICD-10-CM | POA: Diagnosis not present

## 2018-01-21 DIAGNOSIS — Z01419 Encounter for gynecological examination (general) (routine) without abnormal findings: Secondary | ICD-10-CM

## 2018-01-21 NOTE — Progress Notes (Signed)
52 y.o. [id number] Divorced [place] American female here for annual exam.    PCP:  Salome Holmes, MD   Patient's last menstrual period was 08/25/2012.           Sexually active: Yes.    The current method of family planning is status post hysterectomy--ovaries remain.    Exercising: No.   Smoker:  no  Health Maintenance: Pap: 11-12-14 Neg:Neg HR HPV History of abnormal Pap:  Yes, History of cryotherapy in 80's MMG: 02-06-17 poss.mass Lt.Br.,Rt.Br.neg--Diag.Lt.& US reveal mass at 4 o'clock and rec.biopsy, subareolar mass and rec.biopsy, 9 o'clock circumscribed mass which may represent fibroadenoma--6 month follow up.--SEE Epic FOR PATHOLOGY Colonoscopy: 2017 benign polyp with Dr.Mann;next 2027. BMD:   n/a  Result  n/a TDaP:  11-12-14 Gardasil:   no HIV: PCP to do Hep C: PCP to do Screening Labs:  Hb today: PCP Flu vaccine - not done.     reports that she has never smoked. She has never used smokeless tobacco. She reports that she does not drink alcohol or use drugs.  Past Medical History:  Diagnosis Date  . Abnormal Pap smear of cervix    -1980s hx of colposcopy and cryotherapy to cervix--paps normal since  . Anemia 2014   prior to Hysterectomy--because of fibroid  . Cancer ([place]) 02/2017   DCIS--Breast CA  . Fibroid   . History of hiatal hernia    small  . Migraine    h/o migraines prior to hysterectomy    Past Surgical History:  Procedure Laterality Date  . ABDOMINAL HYSTERECTOMY  2014  . APPENDECTOMY  1986  . 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 BREAST BIOPSY Left 03/29/2017   subareolar wing clip- fibroadenoma  . BREAST BIOPSY Left 04/11/2017   9:00 ribbon clip-fibroadenoma  . BREAST CYST EXCISION Left 1982   3 cyst removed  . BREAST CYST EXCISION Right 1982   4 cyst removed  . BREAST LUMPECTOMY Left 05/16/2017   DCIS 4:00  . BREAST LUMPECTOMY Left 05/16/2017   Procedure: BREAST LUMPECTOMY;  Surgeon: Robert Bellow, MD;  Location: [facility];   Service: General;  Laterality: Left;  . CESAREAN SECTION     x 3  . COLONOSCOPY W/ BIOPSIES  12/29/2015   Tubular adenoma without Tomasa Hosteller, [place],.  . CYSTOSCOPY N/A 10/08/2012   Procedure: CYSTOSCOPY;  Surgeon: Azalia Bilis, MD;  Location: [facility];  Service: Gynecology;  Laterality: N/A;  . ROBOTIC ASSISTED TOTAL HYSTERECTOMY N/A 10/08/2012   Procedure: ROBOTIC ASSISTED TOTAL HYSTERECTOMY WITH BILATERAL SALPINGECTOMY;  Surgeon: Azalia Bilis, MD;  Location: [facility];  Service: Gynecology;  Laterality: N/A;  . TUBAL LIGATION      Current Outpatient Medications  Medication Sig Dispense Refill  . acetaminophen (TYLENOL) 500 MG tablet Take 500 mg by mouth every 6 (six) hours as needed (FOR PAIN.).    Marland Kitchen aspirin EC 81 MG tablet Take 81 mg by mouth daily.    . Aspirin-Salicylamide-Caffeine (BC FAST PAIN RELIEF) 650-195-33.3 MG PACK Take 1 packet by mouth daily as needed (FOR PAIN.). BC POWDERS.    Marland Kitchen doxepin (SINEQUAN) 10 MG capsule TAKE 1 CAPSULE BY MOUTH EVERY DAY IN THE EVENING  2  . Multiple Vitamin (MULTIVITAMIN WITH MINERALS) TABS tablet Take 1 tablet by mouth 3 (three) times a week. 2-3 TIMES A WEEK    . tamoxifen (NOLVADEX) 20 MG tablet Take 1 tablet (20 mg total) by mouth daily. 30 tablet 12  .  zolpidem (AMBIEN) 5 MG tablet Take 1 tablet (5 mg total) by mouth at bedtime. Use as needed at bedtime. (Patient taking differently: Take 5 mg by mouth at bedtime. ) 30 tablet 0   No current facility-administered medications for this visit.     Family History  Problem Relation Age of Onset  . Hypertension Mother   . Heart failure Mother   . Stroke Mother        Dec d/t complications of stroke at age 72  . Alzheimer's disease Father   . Diabetes Sister   . Hypertension Sister   . Diabetes Paternal Grandmother   . Breast cancer Neg Hx     Review of Systems  All other systems reviewed and are negative.   Exam:   BP 100/72 (BP Location: Right Arm, Patient Position: Sitting,  Cuff Size: Normal)   Pulse 64   Ht 5\' 2"  (1.575 m)   Wt 137 lb 12.8 oz (62.5 kg)   LMP 08/25/2012   BMI 25.20 kg/m     General appearance: alert, cooperative and appears stated age Head: Normocephalic, without obvious abnormality, atraumatic Neck: no adenopathy, supple, symmetrical, trachea midline and thyroid normal to inspection and palpation Lungs: clear to auscultation bilaterally Breasts: right - normal appearance, no masses or tenderness, No nipple retraction or dimpling, No nipple discharge or bleeding, No axillary or supraclavicular adenopathy Left breast - 5 mm firm mass lateral to scar. No nipple retraction or dimpling.  No nipple discharge or bleeding.  No axillary or supraclavicular adenopathy.   Heart: regular rate and rhythm Abdomen: soft, non-tender; no masses, no organomegaly Extremities: extremities normal, atraumatic, no cyanosis or edema Skin: Skin color, texture, turgor normal. No rashes or lesions Lymph nodes: Cervical, supraclavicular, and axillary nodes normal. No abnormal inguinal nodes palpated Neurologic: Grossly normal  Pelvic: External genitalia:  no lesions              Urethra:  normal appearing urethra with no masses, tenderness or lesions              Bartholins and Skenes: normal                 Vagina: normal appearing vagina with normal color and discharge, no lesions              Cervix:  absent              Pap taken: No. Bimanual Exam:  Uterus:   Absent.              Adnexa: no mass, fullness, tenderness              Rectal exam: Yes.  .  Confirms.              Anus:  normal sphincter tone, no lesions  Chaperone was present for exam.  Assessment:   Well woman visit with normal exam. Status post robotic hysterectomy with bilateral salpingectomy.  DCIS left breast.  Status post lumpectomy and XRT.  On Tamoxifen.  Left breast mass.   Plan: Mammogram dx left and left breast US.   Recommended self breast awareness. Pap and HR HPV as  above. Guidelines for Calcium, Vitamin D, regular exercise program including cardiovascular and weight bearing exercise. Labs with PCP.  Follow up annually and prn.   After visit summary provided.

## 2018-01-21 NOTE — Progress Notes (Signed)
Patient scheduled while in office for bilateral Dx MMG and left breast US, if needed, on 01/30/18 at 3pm at [facility]. Patient verbalizes understanding and is agreeable.

## 2018-01-21 NOTE — Patient Instructions (Signed)

## 2018-01-25 ENCOUNTER — Telehealth: Payer: Self-pay

## 2018-01-25 NOTE — Telephone Encounter (Signed)
Samantha West called with concerns after she saw her primary care MD who identified an area of concern in her breast where she had the biopsy done earlier this fall.  Please contact the patient today and advise.

## 2018-01-25 NOTE — Telephone Encounter (Signed)
Left message to call office for appointment next week prior to additional imaging.

## 2018-01-25 NOTE — Telephone Encounter (Signed)
Left message for patient to see if she could come in on Wednesday 01/30/18 at 10 am instead of the afternoon.

## 2018-01-25 NOTE — Telephone Encounter (Signed)
The patient is concerned about having to much testing too soon and wanted to speak with Dr Bary Castilla about what she should be doing. If she should go ahead and have the mammogram done or if she should wait until March as scheduled.

## 2018-01-30 ENCOUNTER — Ambulatory Visit: Admission: RE | Admit: 2018-01-30 | Payer: BLUE CROSS/BLUE SHIELD | Source: Ambulatory Visit

## 2018-01-30 ENCOUNTER — Other Ambulatory Visit: Payer: Self-pay

## 2018-01-30 ENCOUNTER — Ambulatory Visit: Payer: BLUE CROSS/BLUE SHIELD | Admitting: General Surgery

## 2018-01-30 ENCOUNTER — Ambulatory Visit: Payer: BLUE CROSS/BLUE SHIELD

## 2018-01-30 ENCOUNTER — Encounter: Payer: Self-pay | Admitting: General Surgery

## 2018-01-30 VITALS — BP 128/80 | HR 58 | Temp 97.5°F | Resp 12 | Ht 62.0 in | Wt 138.0 lb

## 2018-01-30 DIAGNOSIS — D0512 Intraductal carcinoma in situ of left breast: Secondary | ICD-10-CM | POA: Diagnosis not present

## 2018-01-30 NOTE — Progress Notes (Signed)
Patient ID: Samantha West, female   DOB: Feb 12, 1966, 52 y.o.   MRN: [id number]  Chief Complaint  Patient presents with  . Follow-up    HPI Samantha West is a 52 y.o. female.  Follow up left breast  DCIS, post lumpectomy 05-16-17. Her gynecologist Dr Quincy Simmonds felt a knot above the incision during her breast exam and had recommended repeat mammography and ultrasound.  HPI  Past Medical History:  Diagnosis Date  . Abnormal Pap smear of cervix    -1980s hx of colposcopy and cryotherapy to cervix--paps normal since  . Anemia 2014   prior to Hysterectomy--because of fibroid  . Cancer ([place]) 02/2017   DCIS--Breast CA  . Fibroid   . History of hiatal hernia    small  . Migraine    h/o migraines prior to hysterectomy    Past Surgical History:  Procedure Laterality Date  . ABDOMINAL HYSTERECTOMY  2014  . APPENDECTOMY  1986  . 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 BREAST BIOPSY Left 03/29/2017   subareolar wing clip- fibroadenoma  . BREAST BIOPSY Left 04/11/2017   9:00 ribbon clip-fibroadenoma  . BREAST CYST EXCISION Left 1982   3 cyst removed  . BREAST CYST EXCISION Right 1982   4 cyst removed  . BREAST LUMPECTOMY Left 05/16/2017   Wide excision 12 mm area high grade DCIS, 1 mm anterior margin(subcutaneous fat). ER 51-90%/ PR 1-10%.  Surgeon: Robert Bellow, MD;  Location: [facility];  Service: General;  Laterality: Left;  . CESAREAN SECTION     x 3  . COLONOSCOPY W/ BIOPSIES  12/29/2015   Tubular adenoma without Tomasa Hosteller, [place],.  . CYSTOSCOPY N/A 10/08/2012   Procedure: CYSTOSCOPY;  Surgeon: Azalia Bilis, MD;  Location: [facility];  Service: Gynecology;  Laterality: N/A;  . ROBOTIC ASSISTED TOTAL HYSTERECTOMY N/A 10/08/2012   Procedure: ROBOTIC ASSISTED TOTAL HYSTERECTOMY WITH BILATERAL SALPINGECTOMY;  Surgeon: Azalia Bilis, MD;  Location: [facility];  Service: Gynecology;  Laterality: N/A;  . TUBAL LIGATION      Family History  Problem Relation  Age of Onset  . Hypertension Mother   . Heart failure Mother   . Stroke Mother        Dec d/t complications of stroke at age 74  . Alzheimer's disease Father   . Diabetes Sister   . Hypertension Sister   . Diabetes Paternal Grandmother   . Breast cancer Neg Hx     Social History Social History   Tobacco Use  . Smoking status: Never Smoker  . Smokeless tobacco: Never Used  Substance Use Topics  . Alcohol use: No    Alcohol/week: 0.0 standard drinks  . Drug use: No    Allergies  Allergen Reactions  . Doxepin Other (See Comments)    jittery    Current Outpatient Medications  Medication Sig Dispense Refill  . acetaminophen (TYLENOL) 500 MG tablet Take 500 mg by mouth every 6 (six) hours as needed (FOR PAIN.).    Marland Kitchen aspirin EC 81 MG tablet Take 81 mg by mouth daily.    . Aspirin-Salicylamide-Caffeine (BC FAST PAIN RELIEF) 650-195-33.3 MG PACK Take 1 packet by mouth daily as needed (FOR PAIN.). BC POWDERS.    . Multiple Vitamin (MULTIVITAMIN WITH MINERALS) TABS tablet Take 1 tablet by mouth 3 (three) times a week. 2-3 TIMES A WEEK    . tamoxifen (NOLVADEX) 20 MG tablet Take 1 tablet (20 mg total) by mouth daily.  30 tablet 12   No current facility-administered medications for this visit.     Review of Systems Review of Systems  Constitutional: Negative.   Respiratory: Negative.   Cardiovascular: Negative.     Blood pressure 128/80, pulse (!) 58, temperature (!) 97.5 F (36.4 C), temperature source Skin, resp. rate 12, height 5\' 2"  (1.575 m), weight 138 lb (62.6 kg), last menstrual period 08/25/2012, SpO2 99 %.  Physical Exam Physical Exam  Constitutional: She is oriented to person, place, and time. She appears well-developed and well-nourished.  HENT:  Mouth/Throat: Oropharynx is clear and moist.  Eyes: Conjunctivae are normal.  Neck: Neck supple.  Pulmonary/Chest: Right breast exhibits no inverted nipple, no mass, no nipple discharge, no skin change and no  tenderness. Left breast exhibits no inverted nipple, no mass, no nipple discharge, no skin change and no tenderness.  Left breast lumpectomy site well healed.  5 mm thickening 2 CFN medial aspect of incision left breast.    Musculoskeletal:       Feet:  Neurological: She is alert and oriented to person, place, and time.  Skin: Skin is warm and dry.  Psychiatric: Her behavior is normal.    Data Reviewed Pathology was reviewed.  Closest margin was anterior at the wide excision site but was 1 mm.  Assessment    Scarring post wide excision and whole breast radiation.  Plantars wart left foot.    Plan    I would would defer imaging at this time.  The patient will continue monthly self-exam.  She will report if any changes appreciated.  Discouraged from daily manipulation.  Mammograms and clinical exam in March 2020. Occlusal HC to left foot       HPI, Physical Exam, Assessment and Plan have been scribed under the direction and in the presence of Robert Bellow, MD. Karie Fetch, RN  I have completed the exam and reviewed the above documentation for accuracy and completeness.  I agree with the above.  Haematologist has been used and any errors in dictation or transcription are unintentional.  Hervey Ard, M.D., F.A.C.S.  Forest Gleason Shuntia Exton 01/30/2018, 2:54 PM

## 2018-01-30 NOTE — Patient Instructions (Addendum)
The patient is aware to call back for any questions or new concerns. Occlusal HC to left foot

## 2018-02-13 ENCOUNTER — Other Ambulatory Visit: Payer: Self-pay

## 2018-02-13 ENCOUNTER — Ambulatory Visit
Admission: RE | Admit: 2018-02-13 | Discharge: 2018-02-13 | Disposition: A | Discharge status: Home / Self Care | Payer: BLUE CROSS/BLUE SHIELD | Source: Ambulatory Visit | Attending: Radiation Oncology | Admitting: Radiation Oncology

## 2018-02-13 ENCOUNTER — Encounter: Payer: Self-pay | Admitting: Radiation Oncology

## 2018-02-13 DIAGNOSIS — Z923 Personal history of irradiation: Secondary | ICD-10-CM | POA: Insufficient documentation

## 2018-02-13 DIAGNOSIS — Z79810 Long term (current) use of selective estrogen receptor modulators (SERMs): Secondary | ICD-10-CM | POA: Diagnosis not present

## 2018-02-13 DIAGNOSIS — Z170 Estrogen receptor positive status [ER+]: Secondary | ICD-10-CM | POA: Insufficient documentation

## 2018-02-13 DIAGNOSIS — D0512 Intraductal carcinoma in situ of left breast: Secondary | ICD-10-CM

## 2018-02-13 NOTE — Progress Notes (Signed)
Radiation Oncology Follow up Note  Name: Samantha West   Date:   02/13/2018 MRN:  [id number] DOB: [birth year]    This 52 y.o. female presents to the clinic today for six-month follow-up status post whole breast relation to her left breast for ductal carcinoma in situ ER/PR positive.  REFERRING PROVIDER: Sharyne Peach, MD  HPI: patient is a 52 year old female now out 6 months having completed whole breast radiation to her left breast first ductal carcinoma in situ ER/PR positive. Seen today in routine follow up she is doing well she's felt a small nodulelocated near her scar well-circumscribednot firm may be consistent with fat necrosis. She scheduled for mammograms in March. She otherwise specifically denies breast tenderness cough or bone pain..she's currently on tamoxifen tolerating that well without side effect  COMPLICATIONS OF TREATMENT: none  FOLLOW UP COMPLIANCE: keeps appointments   PHYSICAL EXAM:  BP (P) 132/65 (BP Location: Left Arm, Patient Position: Sitting)   Pulse (P) 78   Temp (P) 97.6 F (36.4 C) (Tympanic)   Wt (P) 136 lb 11 oz (62 kg)   LMP 08/25/2012   BMI (P) 25.00 kg/m  Lungs are clear to A&P cardiac examination essentially unremarkable with regular rate and rhythm. No dominant mass or nodularity is noted in either breast in 2 positions examined. Incision is well-healed. No axillary or supraclavicular adenopathy is appreciated. Cosmetic result is excellent.small circumscribed non-firm nodule noted at the 3:00 position of the left breast consistent with fat necrosisWell-developed well-nourished patient in NAD. HEENT reveals PERLA, EOMI, discs not visualized.  Oral cavity is clear. No oral mucosal lesions are identified. Neck is clear without evidence of cervical or supraclavicular adenopathy. Lungs are clear to A&P. Cardiac examination is essentially unremarkable with regular rate and rhythm without murmur rub or thrill. Abdomen is benign with no organomegaly or  masses noted. Motor sensory and DTR levels are equal and symmetric in the upper and lower extremities. Cranial nerves II through XII are grossly intact. Proprioception is intact. No peripheral adenopathy or edema is identified. No motor or sensory levels are noted. Crude visual fields are within normal range.  RADIOLOGY RESULTS: no current films for review  PLAN: resent time patient is doing well 6 months out. She's followed closely by Dr. Tollie Pizza will have imaging in March. I do believe this is a little nodule of fat necrosis however if anything mammographically shows up I'm sure it will be biopsied. I've explained that all to the patient. I have asked to see her back in 6 months for follow-up. Patient is to call sooner with any concerns. She continues on tamoxifen without side effect.  I would like to take this opportunity to thank you for allowing me to participate in the care of your patient.Noreene Filbert, MD

## 2018-02-26 ENCOUNTER — Emergency Department
Admission: EM | Admit: 2018-02-26 | Discharge: 2018-02-27 | Disposition: A | Discharge status: Home / Self Care | Payer: BLUE CROSS/BLUE SHIELD | Attending: Emergency Medicine | Admitting: Emergency Medicine

## 2018-02-26 ENCOUNTER — Encounter: Payer: Self-pay | Admitting: *Deleted

## 2018-02-26 ENCOUNTER — Other Ambulatory Visit: Payer: Self-pay

## 2018-02-26 DIAGNOSIS — Z7982 Long term (current) use of aspirin: Secondary | ICD-10-CM | POA: Diagnosis not present

## 2018-02-26 DIAGNOSIS — R1010 Upper abdominal pain, unspecified: Secondary | ICD-10-CM

## 2018-02-26 DIAGNOSIS — R112 Nausea with vomiting, unspecified: Secondary | ICD-10-CM | POA: Insufficient documentation

## 2018-02-26 DIAGNOSIS — K279 Peptic ulcer, site unspecified, unspecified as acute or chronic, without hemorrhage or perforation: Secondary | ICD-10-CM

## 2018-02-26 DIAGNOSIS — Z79899 Other long term (current) drug therapy: Secondary | ICD-10-CM | POA: Diagnosis not present

## 2018-02-26 DIAGNOSIS — R109 Unspecified abdominal pain: Secondary | ICD-10-CM | POA: Diagnosis present

## 2018-02-26 LAB — URINALYSIS, COMPLETE (UACMP) WITH MICROSCOPIC
BILIRUBIN URINE: NEGATIVE
Glucose, UA: NEGATIVE mg/dL
Ketones, ur: 5 mg/dL — AB
LEUKOCYTES UA: NEGATIVE
Nitrite: NEGATIVE
PH: 5 (ref 5.0–8.0)
Protein, ur: 30 mg/dL — AB
SPECIFIC GRAVITY, URINE: 1.016 (ref 1.005–1.030)

## 2018-02-26 LAB — COMPREHENSIVE METABOLIC PANEL
ALT: 15 U/L (ref 0–44)
ANION GAP: 10 (ref 5–15)
AST: 28 U/L (ref 15–41)
Albumin: 4.3 g/dL (ref 3.5–5.0)
Alkaline Phosphatase: 30 U/L — ABNORMAL LOW (ref 38–126)
BUN: 12 mg/dL (ref 6–20)
CHLORIDE: 106 mmol/L (ref 98–111)
CO2: 23 mmol/L (ref 22–32)
Calcium: 9.1 mg/dL (ref 8.9–10.3)
Creatinine, Ser: 0.92 mg/dL (ref 0.44–1.00)
GFR calc non Af Amer: >60 mL/min (ref >60)
Glucose, Bld: 113 mg/dL — ABNORMAL HIGH (ref 70–99)
POTASSIUM: 3.5 mmol/L (ref 3.5–5.1)
SODIUM: 139 mmol/L (ref 135–145)
Total Bilirubin: 0.8 mg/dL (ref 0.3–1.2)
Total Protein: 7.7 g/dL (ref 6.5–8.1)

## 2018-02-26 LAB — CBC
HEMATOCRIT: 37.7 % (ref 36.0–46.0)
HEMOGLOBIN: 12.2 g/dL (ref 12.0–15.0)
MCH: 27.7 pg (ref 26.0–34.0)
MCHC: 32.4 g/dL (ref 30.0–36.0)
MCV: 85.5 fL (ref 80.0–100.0)
NRBC: 0 % (ref 0.0–0.2)
Platelets: 191 10*3/uL (ref 150–400)
RBC: 4.41 MIL/uL (ref 3.87–5.11)
RDW: 13.7 % (ref 11.5–15.5)
WBC: 5.2 10*3/uL (ref 4.0–10.5)

## 2018-02-26 LAB — LIPASE, BLOOD: LIPASE: 25 U/L (ref 11–51)

## 2018-02-26 NOTE — ED Triage Notes (Signed)
Pt states she had food on Friday that upset her stomach and since then she has felt nauseated and sick. This am abd pain worsened and she has been vomiting

## 2018-02-27 ENCOUNTER — Emergency Department: Payer: BLUE CROSS/BLUE SHIELD

## 2018-02-27 MED ORDER — SUCRALFATE 1 GM/10ML PO SUSP: 1.0000 g | Freq: Four times a day (QID) | ORAL | 1 refills | Status: DC

## 2018-02-27 MED ORDER — PANTOPRAZOLE SODIUM 40 MG PO TBEC: 40.0000 mg | DELAYED_RELEASE_TABLET | Freq: Every day | ORAL | 0 refills | Status: DC

## 2018-02-27 MED ORDER — ONDANSETRON 4 MG PO TBDP: 4.0000 mg | ORAL_TABLET | Freq: Three times a day (TID) | ORAL | 0 refills | Status: DC | PRN

## 2018-02-27 MED ORDER — DICYCLOMINE HCL 20 MG PO TABS: 20.0000 mg | ORAL_TABLET | Freq: Four times a day (QID) | ORAL | 0 refills | Status: DC | PRN

## 2018-02-27 MED ORDER — FAMOTIDINE IN NACL 20-0.9 MG/50ML-% IV SOLN
20.0000 mg | Freq: Once | INTRAVENOUS | Status: AC
  Administered 2018-02-27: 20 mg via INTRAVENOUS
  Filled 2018-02-27: qty 50

## 2018-02-27 MED ORDER — ONDANSETRON HCL 4 MG/2ML IJ SOLN
4.0000 mg | Freq: Once | INTRAMUSCULAR | Status: AC
  Administered 2018-02-27: 4 mg via INTRAVENOUS
  Filled 2018-02-27: qty 2

## 2018-02-27 MED ORDER — SODIUM CHLORIDE 0.9 % IV BOLUS
1000.0000 mL | Freq: Once | INTRAVENOUS | Status: AC
  Administered 2018-02-27: 1000 mL via INTRAVENOUS

## 2018-02-27 MED ORDER — FENTANYL CITRATE (PF) 100 MCG/2ML IJ SOLN
50.0000 ug | Freq: Once | INTRAMUSCULAR | Status: AC
  Administered 2018-02-27: 50 ug via INTRAVENOUS
  Filled 2018-02-27: qty 2

## 2018-02-27 NOTE — Discharge Instructions (Signed)
1.  Start Protonix 40 mg daily. 2.  Start Carafate 4 times daily with meals and at bedtime. 3.  You may take medicines as needed for abdominal discomfort and nausea (Bentyl/Zofran #20). 4.  Eat a bland diet for the next week, then slowly advance diet as tolerated.  Avoid heavy, greasy, spicy foods, alcohol, peppermint and chocolates. 5.  Return to the ER for worsening symptoms, persistent vomiting, difficulty breathing or other concerns.

## 2018-02-27 NOTE — ED Provider Notes (Signed)
[facility] Emergency Department Provider Note   ____________________________________________   First MD Initiated Contact with Patient 02/27/18 [phone]     (approximate)  I have reviewed the triage vital signs and the nursing notes.   HISTORY  Chief Complaint Abdominal Pain    HPI Samantha West is a 52 y.o. female who presents to the ED from home with a chief complaint of abdominal pain, nausea and vomiting.  5 days ago patient ate at [employer] and approximately 1 hour later felt gassy with abdominal discomfort.  Over the next several days she is continued to feel right upper quadrant abdominal discomfort which now she describes as pain.  Has been nauseated but today has vomited 6 times.  Denies associated fever, chills, chest pain, shortness of breath, dysuria, diarrhea/constipation.  Denies recent travel or trauma.   Past Medical History:  Diagnosis Date  . Abnormal Pap smear of cervix    -1980s hx of colposcopy and cryotherapy to cervix--paps normal since  . Anemia 2014   prior to Hysterectomy--because of fibroid  . Cancer ([place]) 02/2017   DCIS--Breast CA  . Fibroid   . History of hiatal hernia    small  . Migraine    h/o migraines prior to hysterectomy    Patient Active Problem List   Diagnosis Date Noted  . 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 ABDOMINAL HYSTERECTOMY  2014  . APPENDECTOMY  1986  . 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 BREAST BIOPSY Left 03/29/2017   subareolar wing clip- fibroadenoma  . BREAST BIOPSY Left 04/11/2017   9:00 ribbon clip-fibroadenoma  . BREAST CYST EXCISION Left 1982   3 cyst removed  . BREAST CYST EXCISION Right 1982   4 cyst removed  . BREAST LUMPECTOMY Left 05/16/2017   Wide excision 12 mm area high grade DCIS, 1 mm anterior margin(subcutaneous fat). ER 51-90%/ PR 1-10%.  Surgeon: Robert Bellow, MD;  Location: [facility] ORS;  Service: General;  Laterality: Left;  . CESAREAN SECTION     x 3  . COLONOSCOPY W/ BIOPSIES  12/29/2015   Tubular adenoma without Tomasa Hosteller, [place],.  . CYSTOSCOPY N/A 10/08/2012   Procedure: CYSTOSCOPY;  Surgeon: Azalia Bilis, MD;  Location: [facility] [facility];  Service: Gynecology;  Laterality: N/A;  . ROBOTIC ASSISTED TOTAL HYSTERECTOMY N/A 10/08/2012   Procedure: ROBOTIC ASSISTED TOTAL HYSTERECTOMY WITH BILATERAL SALPINGECTOMY;  Surgeon: Azalia Bilis, MD;  Location: [facility] [facility];  Service: Gynecology;  Laterality: N/A;  . TUBAL LIGATION      Prior to Admission medications   Medication Sig Start Date End Date Taking? Authorizing Provider  acetaminophen (TYLENOL) 500 MG tablet Take 500 mg by mouth every 6 (six) hours as needed (FOR PAIN.).    [provider]  aspirin EC 81 MG tablet Take 81 mg by mouth daily.    [provider]  Aspirin-Salicylamide-Caffeine (BC FAST PAIN RELIEF) 650-195-33.3 MG PACK Take 1 packet by mouth daily as needed (FOR PAIN.). BC POWDERS.    [provider]  dicyclomine (BENTYL) 20 MG tablet Take 1 tablet (20 mg total) by mouth every 6 (six) hours as needed. 02/27/18   Paulette Blanch, MD  Multiple Vitamin (MULTIVITAMIN WITH MINERALS) TABS tablet Take 1 tablet by mouth 3 (three) times a week. 2-3 TIMES A WEEK    [provider]  ondansetron (  ZOFRAN ODT) 4 MG disintegrating tablet Take 1 tablet (4 mg total) by mouth every 8 (eight) hours as needed for nausea or vomiting. 02/27/18   Paulette Blanch, MD  pantoprazole (PROTONIX) 40 MG tablet Take 1 tablet (40 mg total) by mouth daily. 02/27/18   Paulette Blanch, MD  sucralfate (CARAFATE) 1 GM/10ML suspension Take 10 mLs (1 g total) by mouth 4 (four) times daily. 02/27/18   Paulette Blanch, MD  tamoxifen (NOLVADEX) 20 MG tablet Take 1 tablet (20 mg total) by mouth daily. 07/17/17   Robert Bellow, MD    Allergies Doxepin  Family History  Problem Relation Age  of Onset  . Hypertension Mother   . Heart failure Mother   . Stroke Mother        Dec d/t complications of stroke at age 28  . Alzheimer's disease Father   . Diabetes Sister   . Hypertension Sister   . Diabetes Paternal Grandmother   . Breast cancer Neg Hx     Social History Social History   Tobacco Use  . Smoking status: Never Smoker  . Smokeless tobacco: Never Used  Substance Use Topics  . Alcohol use: No    Alcohol/week: 0.0 standard drinks  . Drug use: No    Review of Systems  Constitutional: No fever/chills Eyes: No visual changes. ENT: No sore throat. Cardiovascular: Denies chest pain. Respiratory: Denies shortness of breath. Gastrointestinal: Positive for abdominal pain, nausea and vomiting.  No diarrhea.  No constipation. Genitourinary: Negative for dysuria. Musculoskeletal: Negative for back pain. Skin: Negative for rash. Neurological: Negative for headaches, focal weakness or numbness.   ____________________________________________   PHYSICAL EXAM:  VITAL SIGNS: ED Triage Vitals  Enc Vitals Group     BP 02/26/18 2201 (!) 156/96     Pulse Rate 02/26/18 2201 85     Resp 02/26/18 2201 (!) 22     Temp 02/26/18 2201 99.3 F (37.4 C)     Temp Source 02/26/18 2201 Oral     SpO2 02/26/18 2201 100 %     Weight 02/26/18 2203 135 lb (61.2 kg)     Height 02/26/18 2203 5\' 2"  (1.575 m)     Head Circumference --      Peak Flow --      Pain Score 02/26/18 2202 9     Pain Loc --      Pain Edu? --      Excl. in [place]? --     Constitutional: Alert and oriented. Well appearing and in mild acute distress. Eyes: Conjunctivae are normal. PERRL. EOMI. Head: Atraumatic. Nose: No congestion/rhinnorhea. Mouth/Throat: Mucous membranes are moist.  Oropharynx non-erythematous. Neck: No stridor.   Cardiovascular: Normal rate, regular rhythm. Grossly normal heart sounds.  Good peripheral circulation. Respiratory: Normal respiratory effort.  No retractions. Lungs  CTAB. Gastrointestinal: Soft and mildly tender to palpation right upper quadrant without rebound or guarding. No distention. No abdominal bruits. No CVA tenderness. Musculoskeletal: No lower extremity tenderness nor edema.  No joint effusions. Neurologic:  Normal speech and language. No gross focal neurologic deficits are appreciated. No gait instability. Skin:  Skin is warm, dry and intact. No rash noted. Psychiatric: Mood and affect are normal. Speech and behavior are normal.  ____________________________________________   LABS (all labs ordered are listed, but only abnormal results are displayed)  Labs Reviewed  COMPREHENSIVE METABOLIC PANEL - Abnormal; Notable for the following components:      Result Value   Glucose, Bld 113 (*)  Alkaline Phosphatase 30 (*)    All other components within normal limits  URINALYSIS, COMPLETE (UACMP) WITH MICROSCOPIC - Abnormal; Notable for the following components:   Color, Urine YELLOW (*)    APPearance CLEAR (*)    Hgb urine dipstick LARGE (*)    Ketones, ur 5 (*)    Protein, ur 30 (*)    RBC / HPF >50 (*)    Bacteria, UA RARE (*)    All other components within normal limits  LIPASE, BLOOD  CBC   ____________________________________________  EKG  ED ECG REPORT I, SUNG,JADE J, the attending physician, personally viewed and interpreted this ECG.   Date: 02/27/2018  EKG Time: 2208  Rate: 83  Rhythm: normal EKG, normal sinus rhythm  Axis: Normal  Intervals:none  ST&T Change: Nonspecific  ____________________________________________  RADIOLOGY  ED MD interpretation: No cholecystitis; incidental liver cysts  Official radiology report(s): US Abdomen Limited Ruq  Result Date: 02/27/2018 CLINICAL DATA:  Right upper quadrant pain for several days EXAM: ULTRASOUND ABDOMEN LIMITED RIGHT UPPER QUADRANT COMPARISON:  None. FINDINGS: Gallbladder: No gallstones or wall thickening visualized. No sonographic Murphy sign noted by  sonographer. Common bile duct: Diameter: 2.1 mm Liver: Echogenicity of the liver is within normal limits. Multiple cystic lesions are seen with septations. The largest of these lies on the right measuring 6.1 cm in greatest dimension. Portal vein is patent on color Doppler imaging with normal direction of blood flow towards the liver. IMPRESSION: Cystic appearing lesions within the liver as described with some septations. Likely represent mildly complicated cysts. Nonemergent MRI would be helpful for further evaluation for optimum imaging. No other focal abnormality is noted. Electronically Signed   By: Inez Catalina M.D.   On: 02/27/2018 02:26    ____________________________________________   PROCEDURES  Procedure(s) performed: None  Procedures  Critical Care performed: No  ____________________________________________   INITIAL IMPRESSION / ASSESSMENT AND PLAN / ED COURSE  As part of my medical decision making, I reviewed the following data within the [place] notes reviewed and incorporated, Labs reviewed, EKG interpreted, Old chart reviewed, Radiograph reviewed and Notes from prior ED visits   52 year old female who presents with a several day history of upper abdominal pain and nausea, now with vomiting. Differential diagnosis includes, but is not limited to, biliary disease (biliary colic, acute cholecystitis, cholangitis, choledocholithiasis, etc), intrathoracic causes for epigastric abdominal pain including ACS, gastritis, duodenitis, pancreatitis, small bowel or large bowel obstruction, abdominal aortic aneurysm, hernia, and ulcer(s).  Laboratory results unremarkable with the exception of mild ketones and microscopic hematuria.  While this may suggest kidney stones, will first obtain right upper quadrant abdominal ultrasound to evaluate for cholecystitis.  Will initiate IV fluid resuscitation, 50 mcg IV fentanyl for pain paired with 4 mg IV Zofran for  nausea.  Will also administer 20 mg IV Pepcid to cover GERD symptoms.  Clinical Course as of Feb 28 520  Wed Feb 27, 2018  0302 She is feeling better.  Updated her on ultrasound results.  Will refer her to PCP for follow-up liver cyst.  We will also provide contact information for GI for follow-up.  Strict return precautions given.  Patient verbalizes understanding and agrees with plan of care.   [JS]  0307 Patient tolerated liquids without emesis.  Also tells me during our rapid conversation that she has a remote history of peptic ulcer disease.  Will place her on Protonix and Carafate; Bentyl and Zofran to use as needed.  We  did discuss that she needs to follow-up with her PCP or GI to further evaluate liver cysts.   [JS]    Clinical Course User Index [JS] Paulette Blanch, MD     ____________________________________________   FINAL CLINICAL IMPRESSION(S) / ED DIAGNOSES  Final diagnoses:  Pain of upper abdomen  Non-intractable vomiting with nausea, unspecified vomiting type  Peptic ulcer     ED Discharge Orders         Ordered    sucralfate (CARAFATE) 1 GM/10ML suspension  4 times daily     02/27/18 0304    pantoprazole (PROTONIX) 40 MG tablet  Daily     02/27/18 0304    dicyclomine (BENTYL) 20 MG tablet  Every 6 hours PRN     02/27/18 0304    ondansetron (ZOFRAN ODT) 4 MG disintegrating tablet  Every 8 hours PRN     02/27/18 0304           Note:  This document was prepared using Dragon voice recognition software and may include unintentional dictation errors.    Paulette Blanch, MD 02/27/18 [phone]

## 2018-03-04 ENCOUNTER — Telehealth: Payer: Self-pay | Admitting: General Surgery

## 2018-03-04 NOTE — Telephone Encounter (Signed)
Patient had questions about aspirin and her recent diagnosis of stomach ulcer. Patient informed that she may take a Buffered Aspirin instead as this has a special coating that does not open un in the stomach. We reviewed her other medications recently prescribed and there are no interactions with Tamoxifen. She is aware to call back with any further questions.

## 2018-03-04 NOTE — Telephone Encounter (Signed)
Patient is calling and has had a medication change due to an ulcer that came up on christmas day. Patient is stuck and was told to take one medication but also told she is not to take the medication, and also wants to make sure the medication doesn't inter react with the medication she is on. Please call patient and advise.

## 2018-03-27 ENCOUNTER — Other Ambulatory Visit: Payer: Self-pay | Admitting: Family Medicine

## 2018-03-27 DIAGNOSIS — K7689 Other specified diseases of liver: Secondary | ICD-10-CM

## 2018-04-08 ENCOUNTER — Telehealth: Payer: Self-pay | Admitting: Obstetrics and Gynecology

## 2018-04-08 NOTE — Telephone Encounter (Signed)
Spoke with patient. Patient reports increased external vulvar irritation, discomfort and itching, started 3-4 days ago. Burns when urine touches skin, skin is open in some areas, inflamed with some bleeding. Patient denies vaginal bleeding, vaginal odor or discharge,  urinary symptoms or  Fever/chills. Has been applying cortisone cream externally, provided some relief.   Advised OV needed for further evaluation. Patient declines OV for today, working in [place] with Dr. Quincy Simmonds on 2/5, patient declined, has 2 other appts. Patient request OV with any provider on 2/4. OV scheduled for 2/4 at 3pm with Dr. Sabra Heck.   Routing to provider for final review. Patient is agreeable to disposition. Will close encounter.  Cc: Dr. Sabra Heck

## 2018-04-08 NOTE — Telephone Encounter (Signed)
Patient called requesting to speak with the nurse. She said she is experiencing some pelvic pain, pressure, and irritation.  Last seen: 01/21/18

## 2018-04-09 ENCOUNTER — Encounter: Payer: Self-pay | Admitting: Obstetrics and Gynecology

## 2018-04-09 ENCOUNTER — Ambulatory Visit (INDEPENDENT_AMBULATORY_CARE_PROVIDER_SITE_OTHER): Payer: BLUE CROSS/BLUE SHIELD | Admitting: Obstetrics and Gynecology

## 2018-04-09 VITALS — BP 116/70 | HR 84 | Resp 16 | Ht 62.0 in | Wt 133.0 lb

## 2018-04-09 DIAGNOSIS — N760 Acute vaginitis: Secondary | ICD-10-CM

## 2018-04-09 MED ORDER — NYSTATIN-TRIAMCINOLONE 100000-0.1 UNIT/GM-% EX CREA: 1.0000 "application " | TOPICAL_CREAM | Freq: Two times a day (BID) | CUTANEOUS | 0 refills | Status: DC

## 2018-04-09 MED ORDER — FLUCONAZOLE 150 MG PO TABS: 150.0000 mg | ORAL_TABLET | Freq: Once | ORAL | 0 refills | Status: AC

## 2018-04-09 NOTE — Patient Instructions (Signed)
Vaginitis  Vaginitis is a condition in which the vaginal tissue swells and becomes red (inflamed). This condition is most often caused by a change in the normal balance of bacteria and yeast that live in the vagina. This change causes an overgrowth of certain bacteria or yeast, which causes the inflammation. There are different types of vaginitis, but the most common types are:   Bacterial vaginosis.   Yeast infection (candidiasis).   Trichomoniasis vaginitis. This is a sexually transmitted disease (STD).   Viral vaginitis.   Atrophic vaginitis.   Allergic vaginitis.  What are the causes?  The cause of this condition depends on the type of vaginitis. It can be caused by:   Bacteria (bacterial vaginosis).   Yeast, which is a fungus (yeast infection).   A parasite (trichomoniasis vaginitis).   A virus (viral vaginitis).   Low hormone levels (atrophic vaginitis). Low hormone levels can occur during pregnancy, breastfeeding, or after menopause.   Irritants, such as bubble baths, scented tampons, and feminine sprays (allergic vaginitis).  Other factors can change the normal balance of the yeast and bacteria that live in the vagina. These include:   Antibiotic medicines.   Poor hygiene.   Diaphragms, vaginal sponges, spermicides, birth control pills, and intrauterine devices (IUD).   Sex.   Infection.   Uncontrolled diabetes.   A weakened defense (immune) system.  What increases the risk?  This condition is more likely to develop in women who:   Smoke.   Use vaginal douches, scented tampons, or scented sanitary pads.   Wear tight-fitting pants.   Wear thong underwear.   Use oral birth control pills or an IUD.   Have sex without a condom.   Have multiple sex partners.   Have an STD.   Frequently use the spermicide nonoxynol-9.   Eat lots of foods high in sugar.   Have uncontrolled diabetes.   Have low estrogen levels.   Have a weakened immune system from an immune disorder or medical  treatment.   Are pregnant or breastfeeding.  What are the signs or symptoms?  Symptoms vary depending on the cause of the vaginitis. Common symptoms include:   Abnormal vaginal discharge.  ? The discharge is white, gray, or yellow with bacterial vaginosis.  ? The discharge is thick, white, and cheesy with a yeast infection.  ? The discharge is frothy and yellow or greenish with trichomoniasis.   A bad vaginal smell. The smell is fishy with bacterial vaginosis.   Vaginal itching, pain, or swelling.   Sex that is painful.   Pain or burning when urinating.  Sometimes there are no symptoms.  How is this diagnosed?  This condition is diagnosed based on your symptoms and medical history. A physical exam, including a pelvic exam, will also be done. You may also have other tests, including:   Tests to determine the pH level (acidity or alkalinity) of your vagina.   A whiff test, to assess the odor that results when a sample of your vaginal discharge is mixed with a potassium hydroxide solution.   Tests of vaginal fluid. A sample will be examined under a microscope.  How is this treated?  Treatment varies depending on the type of vaginitis you have. Your treatment may include:   Antibiotic creams or pills to treat bacterial vaginosis and trichomoniasis.   Antifungal medicines, such as vaginal creams or suppositories, to treat a yeast infection.   Medicine to ease discomfort if you have viral vaginitis. Your sexual partner   should also be treated.   Estrogen delivered in a cream, pill, suppository, or vaginal ring to treat atrophic vaginitis. If vaginal dryness occurs, lubricants and moisturizing creams may help. You may need to avoid scented soaps, sprays, or douches.   Stopping use of a product that is causing allergic vaginitis. Then using a vaginal cream to treat the symptoms.  Follow these instructions at home:  Lifestyle   Keep your genital area clean and dry. Avoid soap, and only rinse the area with  water.   Do not douche or use tampons until your health care provider says it is okay to do so. Use sanitary pads, if needed.   Do not have sex until your health care provider approves. When you can return to sex, practice safe sex and use condoms.   Wipe from front to back. This avoids the spread of bacteria from the rectum to the vagina.  General instructions   Take over-the-counter and prescription medicines only as told by your health care provider.   If you were prescribed an antibiotic medicine, take or use it as told by your health care provider. Do not stop taking or using the antibiotic even if you start to feel better.   Keep all follow-up visits as told by your health care provider. This is important.  How is this prevented?   Use mild, non-scented products. Do not use things that can irritate the vagina, such as fabric softeners. Avoid the following products if they are scented:  ? Feminine sprays.  ? Detergents.  ? Tampons.  ? Feminine hygiene products.  ? Soaps or bubble baths.   Let air reach your genital area.  ? Wear cotton underwear to reduce moisture buildup.  ? Avoid wearing underwear while you sleep.  ? Avoid wearing tight pants and underwear or nylons without a cotton panel.  ? Avoid wearing thong underwear.   Take off any wet clothing, such as bathing suits, as soon as possible.   Practice safe sex and use condoms.  Contact a health care provider if:   You have abdominal pain.   You have a fever.   You have symptoms that last for more than 2-3 days.  Get help right away if:   You have a fever and your symptoms suddenly get worse.  Summary   Vaginitis is a condition in which the vaginal tissue becomes inflamed.This condition is most often caused by a change in the normal balance of bacteria and yeast that live in the vagina.   Treatment varies depending on the type of vaginitis you have.   Do not douche, use tampons , or have sex until your health care provider approves. When  you can return to sex, practice safe sex and use condoms.  This information is not intended to replace advice given to you by your health care provider. Make sure you discuss any questions you have with your health care provider.  Document Released: 12/18/2006 Document Revised: 03/28/2016 Document Reviewed: 03/28/2016  [employer] Interactive Patient Education  2019 [employer]

## 2018-04-09 NOTE — Progress Notes (Signed)
GYNECOLOGY  VISIT   HPI: 53 y.o.   Divorced  [place] American  female   [phone] with Patient's last menstrual period was 08/25/2012.   here for vulvar irritation and discomfort that started Thursday, 1/30.  Dx with a suspected ulcer on Christmas. Taking medication which is causing severe constipation.  The pressure from pushing is causing irritation.   No abx.   No H pylori testing yet.  Will see GI.   Her vulvar symptoms are improved today.  Placed Vaseline on the area, and this is now better.  Hydrocortisone was not helpful.  She actually had some bleeding.   No vaginal discharge.  Some ammonia type odor.   GYNECOLOGIC HISTORY: Patient's last menstrual period was 08/25/2012. Contraception:  Hysterectomy Menopausal hormone therapy:  none Last mammogram:  02-06-17 poss.mass Lt.Br.,Rt.Br.neg--Diag.Lt.& US reveal mass at 4 o'clock and rec.biopsy, subareolar mass and rec.biopsy, 9 o'clock circumscribed mass which may represent fibroadenoma--6 month follow up.--SEE Epic FOR PATHOLOGY Last pap smear:   11-12-14 Neg:Neg HR HPV        OB History    Gravida  4   Para  3   Term  3   Preterm      AB  1   Living  3     SAB      TAB      Ectopic      Multiple      Live Births           Obstetric Comments  Menstrual age: 39  Age 1st Pregnancy: 16            Patient Active Problem List   Diagnosis Date Noted  . 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 Abnormal Pap smear of cervix    -1980s hx of colposcopy and cryotherapy to cervix--paps normal since  . Anemia 2014   prior to Hysterectomy--because of fibroid  . Cancer ([place]) 02/2017   DCIS--Breast CA  . Fibroid   . History of hiatal hernia    small  . Migraine    h/o migraines prior to hysterectomy    Past Surgical History:  Procedure Laterality Date  . ABDOMINAL HYSTERECTOMY  2014  . APPENDECTOMY  1986  . BREAST BIOPSY Left 03/29/2017    4:00 coil clip DCIS  . BREAST BIOPSY Left 03/29/2017   subareolar wing clip- fibroadenoma  . BREAST BIOPSY Left 04/11/2017   9:00 ribbon clip-fibroadenoma  . BREAST CYST EXCISION Left 1982   3 cyst removed  . BREAST CYST EXCISION Right 1982   4 cyst removed  . BREAST LUMPECTOMY Left 05/16/2017   Wide excision 12 mm area high grade DCIS, 1 mm anterior margin(subcutaneous fat). ER 51-90%/ PR 1-10%.  Surgeon: Robert Bellow, MD;  Location: [facility];  Service: General;  Laterality: Left;  . CESAREAN SECTION     x 3  . COLONOSCOPY W/ BIOPSIES  12/29/2015   Tubular adenoma without Tomasa Hosteller, [place],.  . CYSTOSCOPY N/A 10/08/2012   Procedure: CYSTOSCOPY;  Surgeon: Azalia Bilis, MD;  Location: [place] [facility];  Service: Gynecology;  Laterality: N/A;  . ROBOTIC ASSISTED TOTAL HYSTERECTOMY N/A 10/08/2012   Procedure: ROBOTIC ASSISTED TOTAL HYSTERECTOMY WITH BILATERAL SALPINGECTOMY;  Surgeon: Azalia Bilis, MD;  Location: [facility];  Service: Gynecology;  Laterality: N/A;  . TUBAL LIGATION      Current Outpatient Medications  Medication Sig Dispense Refill  . acetaminophen (TYLENOL)  500 MG tablet Take 500 mg by mouth every 6 (six) hours as needed (FOR PAIN.).    Marland Kitchen aspirin EC 81 MG tablet Take 81 mg by mouth daily.    . Aspirin-Salicylamide-Caffeine (BC FAST PAIN RELIEF) 650-195-33.3 MG PACK Take 1 packet by mouth daily as needed (FOR PAIN.). BC POWDERS.    Marland Kitchen dicyclomine (BENTYL) 20 MG tablet Take 1 tablet (20 mg total) by mouth every 6 (six) hours as needed. 20 tablet 0  . Multiple Vitamin (MULTIVITAMIN WITH MINERALS) TABS tablet Take 1 tablet by mouth 3 (three) times a week. 2-3 TIMES A WEEK    . ondansetron (ZOFRAN ODT) 4 MG disintegrating tablet Take 1 tablet (4 mg total) by mouth every 8 (eight) hours as needed for nausea or vomiting. 20 tablet 0  . pantoprazole (PROTONIX) 40 MG tablet Take 1 tablet (40 mg total) by mouth daily. 30 tablet 0  . sucralfate (CARAFATE) 1 GM/10ML suspension  Take 10 mLs (1 g total) by mouth 4 (four) times daily. 420 mL 1  . tamoxifen (NOLVADEX) 20 MG tablet Take 1 tablet (20 mg total) by mouth daily. 30 tablet 12   No current facility-administered medications for this visit.      ALLERGIES: Doxepin  Family History  Problem Relation Age of Onset  . Hypertension Mother   . Heart failure Mother   . Stroke Mother        Dec d/t complications of stroke at age 49  . Alzheimer's disease Father   . Diabetes Sister   . Hypertension Sister   . Diabetes Paternal Grandmother   . Breast cancer Neg Hx     Social History   Socioeconomic History  . Marital status: Divorced    Spouse name: Not on file  . Number of children: 3  . Years of education: Not on file  . Highest education level: Not on file  Occupational History    Employer: Environmental health practitioner  Social Needs  . Financial resource strain: Not on file  . Food insecurity:    Worry: Not on file    Inability: Not on file  . Transportation needs:    Medical: Not on file    Non-medical: Not on file  Tobacco Use  . Smoking status: Never Smoker  . Smokeless tobacco: Never Used  Substance and Sexual Activity  . Alcohol use: No    Alcohol/week: 0.0 standard drinks  . Drug use: No  . Sexual activity: Yes    Partners: Male    Birth control/protection: Surgical    Comment: Hysterectomy--ovaries remain  Lifestyle  . Physical activity:    Days per week: Not on file    Minutes per session: Not on file  . Stress: Not on file  Relationships  . Social connections:    Talks on phone: Not on file    Gets together: Not on file    Attends religious service: Not on file    Active member of club or organization: Not on file    Attends meetings of clubs or organizations: Not on file    Relationship status: Not on file  . Intimate partner violence:    Fear of current or ex partner: Not on file    Emotionally abused: Not on file    Physically abused: Not on file    Forced sexual activity: Not  on file  Other Topics Concern  . Not on file  Social History Narrative  . Not on file    Review of  Systems  Constitutional: Negative.   HENT: Negative.   Eyes: Negative.   Respiratory: Negative.   Cardiovascular: Negative.   Gastrointestinal: Negative.   Endocrine: Negative.   Genitourinary:       Vulvar irritation  Musculoskeletal: Negative.   Skin: Negative.   Allergic/Immunologic: Negative.   Neurological: Negative.   Hematological: Negative.   Psychiatric/Behavioral: Negative.     PHYSICAL EXAMINATION:    BP 116/70 (BP Location: Right Arm, Patient Position: Sitting, Cuff Size: Normal)   Pulse 84   Resp 16   Ht 5\' 2"  (1.575 m)   Wt 133 lb (60.3 kg)   LMP 08/25/2012   BMI 24.33 kg/m     General appearance: alert, cooperative and appears stated age    Pelvic: External genitalia:  Caking white discharge of the vulva.               Urethra:  normal appearing urethra with no masses, tenderness or lesions              Bartholins and Skenes: normal                 Vagina: normal appearing vagina with normal color and discharge, no lesions              Cervix:  Absent.                Bimanual Exam:  Uterus:   Absent.               Adnexa: no mass, fullness, tenderness               Chaperone was present for exam.  ASSESSMENT  Vulvovaginitis.  Constipation.   PLAN  Affirm testing.  Mycolog II and Diflucan.  We discussed risk factors for vaginitis.  Start probiotics.  FU prn.    An After Visit Summary was printed and given to the patient.  ___15___ minutes face to face time of which over 50% was spent in counseling.

## 2018-04-10 LAB — VAGINITIS/VAGINOSIS, DNA PROBE
Candida Species: NEGATIVE
Gardnerella vaginalis: POSITIVE — AB
Trichomonas vaginosis: NEGATIVE

## 2018-04-11 ENCOUNTER — Telehealth: Payer: Self-pay

## 2018-04-11 MED ORDER — METRONIDAZOLE 500 MG PO TABS: 500.0000 mg | ORAL_TABLET | Freq: Two times a day (BID) | ORAL | 0 refills | Status: DC

## 2018-04-11 NOTE — Telephone Encounter (Signed)
-----   Message from Nunzio Cobbs, MD sent at 04/10/2018  5:22 PM EST ----- Please inform of Affirm result showing bacterial vaginosis. She may treat with Flagyl 500 mg po bid for 7 days or Metrogel pv at hs for 5 nights.  Please send Rx to pharmacy of choice. ETOH precautions.   I have also treated her for yeast vulvitis with Diflucan and Mycolog II.  I am highlighting this result so you know to contact the patient.

## 2018-04-11 NOTE — Telephone Encounter (Signed)
Spoke with patient and notified of vaginitis testing showing bacterial vaginosis. Will send Rx for Flagyl 500mg  #14, 1 po bid, NR. ETOH precautions given. Verified pharmacy on file.

## 2018-04-12 ENCOUNTER — Other Ambulatory Visit: Payer: Self-pay

## 2018-04-12 ENCOUNTER — Telehealth: Payer: Self-pay | Admitting: Obstetrics and Gynecology

## 2018-04-12 ENCOUNTER — Other Ambulatory Visit: Payer: Self-pay | Admitting: Gastroenterology

## 2018-04-12 DIAGNOSIS — K7689 Other specified diseases of liver: Secondary | ICD-10-CM

## 2018-04-12 DIAGNOSIS — D0512 Intraductal carcinoma in situ of left breast: Secondary | ICD-10-CM

## 2018-04-12 NOTE — Telephone Encounter (Signed)
Patient has questions about her lab results

## 2018-04-12 NOTE — Telephone Encounter (Signed)
Returned call to patient. Patient states she was at work yesterday and would like to review results again. Vaginitis results reviewed with patient as well as instructions for flagyl. Patient verbalized understanding and appreciative of phone call. Advised patient to return call to office if symptoms do not resolve after treatment. Patient agreeable.   Routing to provider and will close encounter.

## 2018-04-12 NOTE — Telephone Encounter (Signed)
Patient returned call to the triage nurse.

## 2018-04-12 NOTE — Telephone Encounter (Signed)
Message left to return call to Triage Nurse at [phone].    

## 2018-04-23 ENCOUNTER — Ambulatory Visit: Admission: RE | Admit: 2018-04-23 | Payer: BLUE CROSS/BLUE SHIELD | Source: Ambulatory Visit

## 2018-05-01 ENCOUNTER — Ambulatory Visit: Admission: RE | Admit: 2018-05-01 | Payer: BLUE CROSS/BLUE SHIELD | Source: Ambulatory Visit

## 2018-05-06 ENCOUNTER — Other Ambulatory Visit: Payer: BLUE CROSS/BLUE SHIELD

## 2018-05-14 ENCOUNTER — Ambulatory Visit: Payer: BLUE CROSS/BLUE SHIELD | Admitting: General Surgery

## 2018-05-16 ENCOUNTER — Ambulatory Visit
Admission: RE | Admit: 2018-05-16 | Discharge: 2018-05-16 | Disposition: A | Discharge status: Home / Self Care | Payer: BLUE CROSS/BLUE SHIELD | Source: Ambulatory Visit | Attending: General Surgery | Admitting: General Surgery

## 2018-05-16 ENCOUNTER — Other Ambulatory Visit: Payer: Self-pay

## 2018-05-16 DIAGNOSIS — D0512 Intraductal carcinoma in situ of left breast: Secondary | ICD-10-CM

## 2018-05-16 HISTORY — DX: Personal history of irradiation: Z92.3

## 2018-05-21 ENCOUNTER — Encounter: Payer: Self-pay | Admitting: General Surgery

## 2018-05-21 ENCOUNTER — Ambulatory Visit
Admission: RE | Admit: 2018-05-21 | Payer: BLUE CROSS/BLUE SHIELD | Source: Home / Self Care | Admitting: Gastroenterology

## 2018-05-21 ENCOUNTER — Encounter: Payer: Self-pay | Admitting: Certified Registered"

## 2018-05-21 ENCOUNTER — Other Ambulatory Visit: Payer: Self-pay

## 2018-05-21 ENCOUNTER — Ambulatory Visit (INDEPENDENT_AMBULATORY_CARE_PROVIDER_SITE_OTHER): Payer: BLUE CROSS/BLUE SHIELD | Admitting: General Surgery

## 2018-05-21 ENCOUNTER — Encounter: Admission: RE | Payer: Self-pay | Source: Home / Self Care

## 2018-05-21 VITALS — BP 126/84 | HR 67 | Temp 97.9°F | Resp 12 | Ht 62.0 in | Wt 131.0 lb

## 2018-05-21 DIAGNOSIS — D0512 Intraductal carcinoma in situ of left breast: Secondary | ICD-10-CM | POA: Diagnosis not present

## 2018-05-21 SURGERY — ESOPHAGOGASTRODUODENOSCOPY (EGD) WITH PROPOFOL
Anesthesia: General

## 2018-05-21 NOTE — Patient Instructions (Signed)
The patient has been asked to return to the office in one year with a bilateral diagnostic mammogram.The patient is aware to call back for any questions or concerns. 

## 2018-05-21 NOTE — Progress Notes (Signed)
Patient ID: Samantha West, female   DOB: 08-Jul-1965, 53 y.o.   MRN: [id number]  Chief Complaint  Patient presents with  . Follow-up    mammogram     HPI Rita Vialpando Raphael is a 53 y.o. female who presents for a breast evaluation. The most recent mammogram was done on 05/16/18.  Patient does perform regular self breast checks and gets regular mammograms done.   No new breast issues.  HPI  Past Medical History:  Diagnosis Date  . Abnormal Pap smear of cervix    -1980s hx of colposcopy and cryotherapy to cervix--paps normal since  . Anemia 2014   prior to Hysterectomy--because of fibroid  . Cancer ([place]) 02/2017   DCIS--Breast CA- Left  . Fibroid   . History of hiatal hernia    small  . Migraine    h/o migraines prior to hysterectomy  . Personal history of radiation therapy 2019   Left breast    Past Surgical History:  Procedure Laterality Date  . ABDOMINAL HYSTERECTOMY  2014  . APPENDECTOMY  1986  . 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 BREAST BIOPSY Left 03/29/2017   subareolar wing clip- fibroadenoma  . BREAST BIOPSY Left 04/11/2017   9:00 ribbon clip-fibroadenoma  . BREAST CYST EXCISION Left 1982   3 cyst removed  . BREAST CYST EXCISION Right 1982   4 cyst removed  . BREAST LUMPECTOMY Left 05/16/2017   Wide excision 12 mm area high grade DCIS, 1 mm anterior margin(subcutaneous fat). ER 51-90%/ PR 1-10%.  Surgeon: Robert Bellow, MD;  Location: [facility];  Service: General;  Laterality: Left;  . CESAREAN SECTION     x 3  . COLONOSCOPY W/ BIOPSIES  12/29/2015   Tubular adenoma without Tomasa Hosteller, [place],.  . CYSTOSCOPY N/A 10/08/2012   Procedure: CYSTOSCOPY;  Surgeon: Azalia Bilis, MD;  Location: [place] [facility];  Service: Gynecology;  Laterality: N/A;  . ROBOTIC ASSISTED TOTAL HYSTERECTOMY N/A 10/08/2012   Procedure: ROBOTIC ASSISTED TOTAL HYSTERECTOMY WITH BILATERAL SALPINGECTOMY;  Surgeon: Azalia Bilis, MD;  Location: [place] [facility];  Service:  Gynecology;  Laterality: N/A;  . TUBAL LIGATION      Family History  Problem Relation Age of Onset  . Hypertension Mother   . Heart failure Mother   . Stroke Mother        Dec d/t complications of stroke at age 84  . Alzheimer's disease Father   . Diabetes Sister   . Hypertension Sister   . Diabetes Paternal Grandmother   . Breast cancer Neg Hx     Social History Social History   Tobacco Use  . Smoking status: Never Smoker  . Smokeless tobacco: Never Used  Substance Use Topics  . Alcohol use: No    Alcohol/week: 0.0 standard drinks  . Drug use: No    Allergies  Allergen Reactions  . Doxepin Other (See Comments)    jittery    Current Outpatient Medications  Medication Sig Dispense Refill  . acetaminophen (TYLENOL) 500 MG tablet Take 500 mg by mouth every 6 (six) hours as needed (FOR PAIN.).    Marland Kitchen aspirin EC 81 MG tablet Take 81 mg by mouth daily.    . Aspirin-Salicylamide-Caffeine (BC FAST PAIN RELIEF) 650-195-33.3 MG PACK Take 1 packet by mouth daily as needed (FOR PAIN.). BC POWDERS.    . fluconazole (DIFLUCAN) 150 MG tablet Take 150 mg by mouth daily.    Marland Kitchen lidocaine-hydrocortisone (ANAMANTLE) 3-1 %  KIT Place 1 application rectally 2 (two) times daily.    . Multiple Vitamin (MULTIVITAMIN WITH MINERALS) TABS tablet Take 1 tablet by mouth 3 (three) times a week. 2-3 TIMES A WEEK    . nystatin-triamcinolone (MYCOLOG II) cream Apply 1 application topically 2 (two) times daily. Apply to affected area BID for up to 7 days. 60 g 0  . ondansetron (ZOFRAN ODT) 4 MG disintegrating tablet Take 1 tablet (4 mg total) by mouth every 8 (eight) hours as needed for nausea or vomiting. 20 tablet 0  . pantoprazole (PROTONIX) 40 MG tablet Take 40 mg by mouth daily.    . sucralfate (CARAFATE) 1 GM/10ML suspension Take 0.1 g by mouth 4 (four) times daily -  with meals and at bedtime.    . tamoxifen (NOLVADEX) 20 MG tablet Take 1 tablet (20 mg total) by mouth daily. 30 tablet 12  . zolpidem  (AMBIEN) 5 MG tablet Take 1 tablet by mouth as needed.     No current facility-administered medications for this visit.     Review of Systems Review of Systems  Constitutional: Negative.   Respiratory: Negative.   Cardiovascular: Negative.     Blood pressure 126/84, pulse 67, temperature 97.9 F (36.6 C), temperature source Temporal, resp. rate 12, height '5\' 2"'  (1.575 m), weight 131 lb (59.4 kg), last menstrual period 08/25/2012, SpO2 99 %.  Physical Exam Physical Exam Exam conducted with a chaperone present.  Constitutional:      Appearance: She is well-developed.  Eyes:     General: No scleral icterus.    Conjunctiva/sclera: Conjunctivae normal.  Neck:     Musculoskeletal: Neck supple.  Cardiovascular:     Rate and Rhythm: Normal rate and regular rhythm.     Heart sounds: Normal heart sounds.  Pulmonary:     Effort: Pulmonary effort is normal.     Breath sounds: Normal breath sounds.  Chest:     Breasts:        Right: No inverted nipple, mass, nipple discharge, skin change or tenderness.        Left: No inverted nipple, mass, nipple discharge, skin change or tenderness.    Lymphadenopathy:     Cervical: No cervical adenopathy.  Skin:    General: Skin is warm and dry.  Neurological:     Mental Status: She is alert and oriented to person, place, and time.     Data Reviewed Bilateral diagnostic mammograms dated May 16, 2018 were reviewed: BI-RADS-2.  Assessment No evidence of recurrent breast cancer.  Good tolerance of antiestrogen therapy (tamoxifen)  Plan  The patient has been asked to return to the office in one year with a bilateral diagnostic mammogram.The patient is aware to call back for any questions or concerns.   HPI, Physical Exam, Assessment and Plan have been scribed under the direction and in the presence of Hervey Ard, MD.  Gaspar Cola, CMA The patient is aware to call back for any questions or new concerns.  I have completed the  exam and reviewed the above documentation for accuracy and completeness.  I agree with the above.  Haematologist has been used and any errors in dictation or transcription are unintentional.  Hervey Ard, M.D., F.A.C.S.  Forest Gleason Ceylon Arenson 05/22/2018, 2:43 PM

## 2018-07-30 ENCOUNTER — Other Ambulatory Visit
Admission: RE | Admit: 2018-07-30 | Discharge: 2018-07-30 | Disposition: A | Discharge status: Home / Self Care | Payer: BLUE CROSS/BLUE SHIELD | Source: Ambulatory Visit | Attending: Gastroenterology | Admitting: Gastroenterology

## 2018-07-30 ENCOUNTER — Other Ambulatory Visit: Payer: Self-pay

## 2018-07-30 DIAGNOSIS — Z1159 Encounter for screening for other viral diseases: Secondary | ICD-10-CM | POA: Insufficient documentation

## 2018-07-31 LAB — NOVEL CORONAVIRUS, NAA (HOSP ORDER, SEND-OUT TO REF LAB; TAT 18-24 HRS): SARS-CoV-2, NAA: NOT DETECTED

## 2018-08-01 ENCOUNTER — Encounter: Payer: Self-pay | Admitting: *Deleted

## 2018-08-02 ENCOUNTER — Ambulatory Visit: Payer: BLUE CROSS/BLUE SHIELD | Admitting: Anesthesiology

## 2018-08-02 ENCOUNTER — Encounter
Admission: RE | Disposition: A | Discharge status: Home / Self Care | Payer: Self-pay | Source: Home / Self Care | Attending: Gastroenterology

## 2018-08-02 ENCOUNTER — Other Ambulatory Visit: Payer: Self-pay

## 2018-08-02 ENCOUNTER — Ambulatory Visit
Admission: RE | Admit: 2018-08-02 | Discharge: 2018-08-02 | Disposition: A | Discharge status: Home / Self Care | Payer: BLUE CROSS/BLUE SHIELD | Attending: Gastroenterology | Admitting: Gastroenterology

## 2018-08-02 DIAGNOSIS — Z7982 Long term (current) use of aspirin: Secondary | ICD-10-CM | POA: Insufficient documentation

## 2018-08-02 DIAGNOSIS — Z79899 Other long term (current) drug therapy: Secondary | ICD-10-CM | POA: Diagnosis not present

## 2018-08-02 DIAGNOSIS — K449 Diaphragmatic hernia without obstruction or gangrene: Secondary | ICD-10-CM | POA: Insufficient documentation

## 2018-08-02 DIAGNOSIS — Z86000 Personal history of in-situ neoplasm of breast: Secondary | ICD-10-CM | POA: Insufficient documentation

## 2018-08-02 DIAGNOSIS — K208 Other esophagitis: Secondary | ICD-10-CM | POA: Insufficient documentation

## 2018-08-02 DIAGNOSIS — R1013 Epigastric pain: Secondary | ICD-10-CM | POA: Diagnosis present

## 2018-08-02 DIAGNOSIS — Z79810 Long term (current) use of selective estrogen receptor modulators (SERMs): Secondary | ICD-10-CM | POA: Insufficient documentation

## 2018-08-02 DIAGNOSIS — Z888 Allergy status to other drugs, medicaments and biological substances status: Secondary | ICD-10-CM | POA: Diagnosis not present

## 2018-08-02 DIAGNOSIS — K2960 Other gastritis without bleeding: Secondary | ICD-10-CM | POA: Diagnosis not present

## 2018-08-02 DIAGNOSIS — K319 Disease of stomach and duodenum, unspecified: Secondary | ICD-10-CM | POA: Insufficient documentation

## 2018-08-02 HISTORY — PX: ESOPHAGOGASTRODUODENOSCOPY (EGD) WITH PROPOFOL: SHX5813

## 2018-08-02 LAB — CBC WITH DIFFERENTIAL/PLATELET
Abs Immature Granulocytes: 0 10*3/uL (ref 0.00–0.07)
Basophils Absolute: 0 10*3/uL (ref 0.0–0.1)
Basophils Relative: 1 %
Eosinophils Absolute: 0.1 10*3/uL (ref 0.0–0.5)
Eosinophils Relative: 3 %
HCT: 35.6 % — ABNORMAL LOW (ref 36.0–46.0)
Hemoglobin: 11.7 g/dL — ABNORMAL LOW (ref 12.0–15.0)
Immature Granulocytes: 0 %
Lymphocytes Relative: 47 %
Lymphs Abs: 1.7 10*3/uL (ref 0.7–4.0)
MCH: 27.9 pg (ref 26.0–34.0)
MCHC: 32.9 g/dL (ref 30.0–36.0)
MCV: 85 fL (ref 80.0–100.0)
Monocytes Absolute: 0.3 10*3/uL (ref 0.1–1.0)
Monocytes Relative: 9 %
Neutro Abs: 1.5 10*3/uL — ABNORMAL LOW (ref 1.7–7.7)
Neutrophils Relative %: 40 %
Platelets: 165 10*3/uL (ref 150–400)
RBC: 4.19 MIL/uL (ref 3.87–5.11)
RDW: 14 % (ref 11.5–15.5)
WBC: 3.6 10*3/uL — ABNORMAL LOW (ref 4.0–10.5)
nRBC: 0 % (ref 0.0–0.2)

## 2018-08-02 SURGERY — ESOPHAGOGASTRODUODENOSCOPY (EGD) WITH PROPOFOL
Anesthesia: General

## 2018-08-02 MED ORDER — SODIUM CHLORIDE 0.9 % IV SOLN: INTRAVENOUS | Status: DC

## 2018-08-02 MED ORDER — SODIUM CHLORIDE 0.9 % IV SOLN
INTRAVENOUS | Status: DC
  Administered 2018-08-02: 1000 mL via INTRAVENOUS

## 2018-08-02 MED ORDER — PROPOFOL 10 MG/ML IV BOLUS
INTRAVENOUS | Status: DC | PRN
  Administered 2018-08-02 (×4): 50 mg via INTRAVENOUS

## 2018-08-02 MED ORDER — GLYCOPYRROLATE 0.2 MG/ML IJ SOLN
INTRAMUSCULAR | Status: DC | PRN
  Administered 2018-08-02: 0.2 mg via INTRAVENOUS

## 2018-08-02 NOTE — H&P (Signed)
Outpatient short stay form Pre-procedure 08/02/2018 8:45 AM Lollie Sails MD  Primary Physician:   Dr Salome Holmes  Reason for visit: EGD  History of present illness: Patient is a 53 year old female presenting today for an EGD regards to her history of possible gastric ulcer.  To the emergency room on 02/27/2019 with abdominal pain.  Treated with Carafate and couple other medications that she is not currently taking.  She was apparently on a proton pump inhibitor as well.  Does take a daily aspirin.  She also takes BC powders.  She denies any aspirin for the past several days.  Is feeling better than she did when she went to the hospital.    Current Facility-Administered Medications:  .  0.9 %  sodium chloride infusion, , Intravenous, Continuous, Lollie Sails, MD .  0.9 %  sodium chloride infusion, , Intravenous, Continuous, Lollie Sails, MD, Last Rate: 20 mL/hr at 08/02/18 0820, 1,000 mL at 08/02/18 0820  Facility-Administered Medications Ordered in Other Encounters:  .  glycopyrrolate (ROBINUL) injection, , , Anesthesia Intra-op, Fletcher-Harrison, Tawana, CRNA, 0.2 mg at 08/02/18 0836  Medications Prior to Admission  Medication Sig Dispense Refill Last Dose  . acetaminophen (TYLENOL) 500 MG tablet Take 500 mg by mouth every 6 (six) hours as needed (FOR PAIN.).   Past Week at Unknown time  . aspirin EC 81 MG tablet Take 81 mg by mouth daily.   Past Week at Unknown time  . Aspirin-Salicylamide-Caffeine (BC FAST PAIN RELIEF) 650-195-33.3 MG PACK Take 1 packet by mouth daily as needed (FOR PAIN.). BC POWDERS.   Past Week at Unknown time  . fluconazole (DIFLUCAN) 150 MG tablet Take 150 mg by mouth daily.   Past Week at Unknown time  . lidocaine-hydrocortisone (ANAMANTLE) 3-1 % KIT Place 1 application rectally 2 (two) times daily.   Past Week at Unknown time  . Multiple Vitamin (MULTIVITAMIN WITH MINERALS) TABS tablet Take 1 tablet by mouth 3 (three) times a week. 2-3 TIMES A  WEEK   Past Week at Unknown time  . nystatin-triamcinolone (MYCOLOG II) cream Apply 1 application topically 2 (two) times daily. Apply to affected area BID for up to 7 days. 60 g 0 Past Week at Unknown time  . ondansetron (ZOFRAN ODT) 4 MG disintegrating tablet Take 1 tablet (4 mg total) by mouth every 8 (eight) hours as needed for nausea or vomiting. 20 tablet 0 Past Week at Unknown time  . pantoprazole (PROTONIX) 40 MG tablet Take 40 mg by mouth daily.   Past Week at Unknown time  . sucralfate (CARAFATE) 1 GM/10ML suspension Take 0.1 g by mouth 4 (four) times daily -  with meals and at bedtime.   Past Week at Unknown time  . tamoxifen (NOLVADEX) 20 MG tablet Take 1 tablet (20 mg total) by mouth daily. 30 tablet 12 Past Week at Unknown time  . zolpidem (AMBIEN) 5 MG tablet Take 1 tablet by mouth as needed.   Past Week at Unknown time     Allergies  Allergen Reactions  . Doxepin Other (See Comments)    jittery     Past Medical History:  Diagnosis Date  . Abnormal Pap smear of cervix    -1980s hx of colposcopy and cryotherapy to cervix--paps normal since  . Anemia 2014   prior to Hysterectomy--because of fibroid  . Cancer ([place]) 02/2017   DCIS--Breast CA- Left  . Fibroid   . History of hiatal hernia    small  .  Migraine    h/o migraines prior to hysterectomy  . Personal history of radiation therapy 2019   Left breast    Review of systems:      Physical Exam    Heart and lungs: Regular rate and rhythm without rub or gallop, lungs are bilaterally clear.    HEENT: Normocephalic atraumatic eyes are anicteric    Other:    Pertinant exam for procedure: Soft nontender nondistended bowel sounds positive normoactive    Planned proceedures: EGD and indicated procedures. I have discussed the risks benefits and complications of procedures to include not limited to bleeding, infection, perforation and the risk of sedation and the patient wishes to proceed.    Lollie Sails,  MD Gastroenterology 08/02/2018  8:45 AM

## 2018-08-02 NOTE — Anesthesia Post-op Follow-up Note (Signed)
Anesthesia QCDR form completed.        

## 2018-08-02 NOTE — Anesthesia Postprocedure Evaluation (Signed)
Anesthesia Post Note  Patient: Samantha West  Procedure(s) Performed: ESOPHAGOGASTRODUODENOSCOPY (EGD) WITH PROPOFOL (N/A )  Patient location during evaluation: Endoscopy Anesthesia Type: General Level of consciousness: awake and alert Pain management: pain level controlled Vital Signs Assessment: post-procedure vital signs reviewed and stable Respiratory status: spontaneous breathing, nonlabored ventilation, respiratory function stable and patient connected to nasal cannula oxygen Cardiovascular status: blood pressure returned to baseline and stable Postop Assessment: no apparent nausea or vomiting Anesthetic complications: no     Last Vitals:  Vitals:   08/02/18 0950 08/02/18 1000  BP: 130/80 135/79  Pulse: 69 67  Resp: 17 15  Temp:    SpO2: 100% 100%    Last Pain:  Vitals:   08/02/18 0808  TempSrc: Tympanic  PainSc: 0-No pain                 Makya Yurko S

## 2018-08-02 NOTE — Transfer of Care (Signed)
Immediate Anesthesia Transfer of Care Note  Patient: Samantha West  Procedure(s) Performed: ESOPHAGOGASTRODUODENOSCOPY (EGD) WITH PROPOFOL (N/A )  Patient Location: Endoscopy Unit  Anesthesia Type:General  Level of Consciousness: awake, drowsy and responds to stimulation  Airway & Oxygen Therapy: Patient Spontanous Breathing and Patient connected to face mask oxygen  Post-op Assessment: Report given to RN and Post -op Vital signs reviewed and stable  Post vital signs: Reviewed and stable  Last Vitals:  Vitals Value Taken Time  BP 108/60 08/02/2018  9:24 AM  Temp    Pulse 71 08/02/2018  9:25 AM  Resp 16 08/02/2018  9:25 AM  SpO2 100 % 08/02/2018  9:25 AM  Vitals shown include unvalidated device data.  Last Pain:  Vitals:   08/02/18 0808  TempSrc: Tympanic  PainSc: 0-No pain         Complications: No apparent anesthesia complications

## 2018-08-02 NOTE — Anesthesia Preprocedure Evaluation (Signed)
Anesthesia Evaluation  Patient identified by MRN, date of birth, ID band Patient awake    Reviewed: Allergy & Precautions, NPO status , Patient's Chart, lab work & pertinent test results, reviewed documented beta blocker date and time   Airway Mallampati: II  TM Distance: >3 FB     Dental  (+) Chipped   Pulmonary           Cardiovascular      Neuro/Psych  Headaches,    GI/Hepatic hiatal hernia,   Endo/Other    Renal/GU      Musculoskeletal   Abdominal   Peds  Hematology  (+) anemia ,   Anesthesia Other Findings   Reproductive/Obstetrics                             Anesthesia Physical Anesthesia Plan  ASA: III  Anesthesia Plan: General   Post-op Pain Management:    Induction: Intravenous  PONV Risk Score and Plan:   Airway Management Planned:   Additional Equipment:   Intra-op Plan:   Post-operative Plan:   Informed Consent: I have reviewed the patients History and Physical, chart, labs and discussed the procedure including the risks, benefits and alternatives for the proposed anesthesia with the patient or authorized representative who has indicated his/her understanding and acceptance.       Plan Discussed with: CRNA  Anesthesia Plan Comments:         Anesthesia Quick Evaluation

## 2018-08-02 NOTE — Op Note (Signed)
[facility] Gastroenterology Patient Name: Samantha West Procedure Date: 08/02/2018 8:33 AM MRN: [id number] Account #: [id number] Date of Birth: May 30, 1965 Admit Type: Outpatient Age: 53 Room: [facility] ENDO ROOM 3 Gender: Female Note Status: Finalized Procedure:            Upper GI endoscopy Indications:          Epigastric abdominal pain Providers:            Lollie Sails, MD Referring MD:         Rubbie Battiest. Iona Beard MD, MD (Referring MD) Medicines:            Monitored Anesthesia Care Complications:        No immediate complications. Procedure:            Pre-Anesthesia Assessment:                       - ASA Grade Assessment: III - A patient with severe                        systemic disease.                       After obtaining informed consent, the endoscope was                        passed under direct vision. Throughout the procedure,                        the patient's blood pressure, pulse, and oxygen                        saturations were monitored continuously. The Endoscope                        was introduced through the mouth, and advanced to the                        third part of duodenum. The upper GI endoscopy was                        accomplished without difficulty. The patient tolerated                        the procedure well. Findings:      LA Grade B (one or more mucosal breaks greater than 5 mm, not extending       between the tops of two mucosal folds) esophagitis with no bleeding was       found. Biopsies were taken with a cold forceps for histology.      A small hiatal hernia was present.      The exam of the esophagus was otherwise normal.      Diffuse and patchy mild inflammation characterized by congestion       (edema), erosions and linear erosions was found in the gastric body and       in the gastric antrum. Biopsies were taken with a cold forceps for       histology. Biopsies were taken with a cold forceps for  Helicobacter       pylori testing.      The cardia and gastric fundus were normal on retroflexion otherwise.  The examined duodenum was normal. Impression:           - LA Grade B erosive esophagitis. Biopsied.                       - Small hiatal hernia.                       - Erosive gastritis. Biopsied.                       - Normal examined duodenum. Recommendation:       - Await pathology results.                       - Use Protonix (pantoprazole) 40 mg PO daily daily.                       - No aspirin, ibuprofen, naproxen, or other                        non-steroidal anti-inflammatory drugs except prescribed                        doses..                       - Return to GI clinic in 4 weeks. Procedure Code(s):    --- Professional ---                       [phone], Esophagogastroduodenoscopy, flexible, transoral;                        with biopsy, single or multiple Diagnosis Code(s):    --- Professional ---                       K20.8, Other esophagitis                       K44.9, Diaphragmatic hernia without obstruction or                        gangrene                       K29.60, Other gastritis without bleeding                       R10.13, Epigastric pain CPT copyright 2019 American Medical Association. All rights reserved. The codes documented in this report are preliminary and upon coder review may  be revised to meet current compliance requirements. Lollie Sails, MD 08/02/2018 9:23:13 AM This report has been signed electronically. Number of Addenda: 0 Note Initiated On: 08/02/2018 8:33 AM      [facility]

## 2018-08-05 ENCOUNTER — Encounter: Payer: Self-pay | Admitting: Gastroenterology

## 2018-08-05 LAB — SURGICAL PATHOLOGY

## 2018-08-08 ENCOUNTER — Ambulatory Visit: Payer: BLUE CROSS/BLUE SHIELD | Admitting: Radiation Oncology

## 2018-08-14 ENCOUNTER — Other Ambulatory Visit: Payer: Self-pay | Admitting: General Surgery

## 2018-08-15 ENCOUNTER — Ambulatory Visit: Payer: BLUE CROSS/BLUE SHIELD | Admitting: Radiation Oncology

## 2018-08-21 ENCOUNTER — Encounter: Payer: Self-pay | Admitting: Radiation Oncology

## 2018-08-21 ENCOUNTER — Ambulatory Visit
Admission: RE | Admit: 2018-08-21 | Discharge: 2018-08-21 | Disposition: A | Discharge status: Home / Self Care | Payer: BC Managed Care – PPO | Source: Ambulatory Visit | Attending: Radiation Oncology | Admitting: Radiation Oncology

## 2018-08-21 ENCOUNTER — Other Ambulatory Visit: Payer: Self-pay

## 2018-08-21 VITALS — BP 132/81 | HR 62 | Temp 97.4°F | Resp 16 | Wt 134.8 lb

## 2018-08-21 DIAGNOSIS — Z79810 Long term (current) use of selective estrogen receptor modulators (SERMs): Secondary | ICD-10-CM | POA: Insufficient documentation

## 2018-08-21 DIAGNOSIS — Z170 Estrogen receptor positive status [ER+]: Secondary | ICD-10-CM | POA: Insufficient documentation

## 2018-08-21 DIAGNOSIS — D0512 Intraductal carcinoma in situ of left breast: Secondary | ICD-10-CM | POA: Insufficient documentation

## 2018-08-21 DIAGNOSIS — Z923 Personal history of irradiation: Secondary | ICD-10-CM | POA: Diagnosis not present

## 2018-08-21 NOTE — Progress Notes (Signed)
Radiation Oncology Follow up Note  Name: Samantha West   Date:   08/21/2018 MRN:  [id number] DOB: 01-Mar-1966    This 53 y.o. 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  She is seen today in routine follow-up is doing well.  She specifically denies breast tenderness cough or bone pain..  She had mammograms back in March which I have reviewed were BI-RADS 2 benign.  She is currently on tamoxifen tolerating that well without side effect.  COMPLICATIONS OF TREATMENT: none  FOLLOW UP COMPLIANCE: keeps appointments   PHYSICAL EXAM:  BP 132/81 (BP Location: Left Arm, Patient Position: Sitting)   Pulse 62   Temp (!) 97.4 F (36.3 C) (Tympanic)   Resp 16   Wt 134 lb 13 oz (61.1 kg)   LMP 08/25/2012   BMI 24.66 kg/m  Lungs are clear to A&P cardiac examination essentially unremarkable with regular rate and rhythm. No dominant mass or nodularity is noted in either breast in 2 positions examined. Incision is well-healed. No axillary or supraclavicular adenopathy is appreciated. Cosmetic result is excellent.  Well-developed well-nourished patient in NAD. HEENT reveals PERLA, EOMI, discs not visualized.  Oral cavity is clear. No oral mucosal lesions are identified. Neck is clear without evidence of cervical or supraclavicular adenopathy. Lungs are clear to A&P. Cardiac examination is essentially unremarkable with regular rate and rhythm without murmur rub or thrill. Abdomen is benign with no organomegaly or masses noted. Motor sensory and DTR levels are equal and symmetric in the upper and lower extremities. Cranial nerves II through XII are grossly intact. Proprioception is intact. No peripheral  adenopathy or edema is identified. No motor or sensory levels are noted. Crude visual fields are within normal range.  RADIOLOGY RESULTS: Mammograms reviewed compatible with above-stated findings  PLAN: Present time patient is doing well with no evidence of disease.  She has had an excellent cosmetic result.  She continues on tamoxifen without side effect.  I have asked to see her back in 1 year for follow-up.  Patient knows to call at anytime with any concerns.  I would like to take this opportunity to thank you for allowing me to participate in the care of your patient.Noreene Filbert, MD

## 2018-09-19 ENCOUNTER — Other Ambulatory Visit: Payer: Self-pay | Admitting: Gastroenterology

## 2018-09-19 DIAGNOSIS — K7689 Other specified diseases of liver: Secondary | ICD-10-CM

## 2018-10-03 ENCOUNTER — Ambulatory Visit
Admission: RE | Admit: 2018-10-03 | Discharge: 2018-10-03 | Disposition: A | Discharge status: Home / Self Care | Payer: BC Managed Care – PPO | Source: Ambulatory Visit | Attending: Gastroenterology | Admitting: Gastroenterology

## 2018-10-03 ENCOUNTER — Other Ambulatory Visit: Payer: Self-pay

## 2018-10-03 ENCOUNTER — Encounter: Payer: Self-pay | Admitting: General Surgery

## 2018-10-03 DIAGNOSIS — K7689 Other specified diseases of liver: Secondary | ICD-10-CM | POA: Diagnosis present

## 2018-10-03 MED ORDER — GADOBUTROL 1 MMOL/ML IV SOLN
6.0000 mL | Freq: Once | INTRAVENOUS | Status: AC | PRN
  Administered 2018-10-03: 6 mL via INTRAVENOUS

## 2018-11-13 ENCOUNTER — Telehealth: Payer: Self-pay | Admitting: Obstetrics and Gynecology

## 2018-11-13 NOTE — Telephone Encounter (Signed)
Left message on voicemail to call and reschedule cancelled appointment. °

## 2019-01-24 ENCOUNTER — Ambulatory Visit: Payer: BLUE CROSS/BLUE SHIELD | Admitting: Obstetrics and Gynecology

## 2019-03-07 DIAGNOSIS — U071 COVID-19: Secondary | ICD-10-CM

## 2019-03-07 HISTORY — DX: COVID-19: U07.1

## 2019-03-21 ENCOUNTER — Telehealth: Payer: Self-pay

## 2019-03-21 NOTE — Telephone Encounter (Signed)
Call to patient regarding her annual mammogram and follow up with our office. She states that she will be seeing Dr Bary Castilla from now on for her breast care.

## 2019-03-31 ENCOUNTER — Other Ambulatory Visit: Payer: Self-pay | Admitting: General Surgery

## 2019-03-31 DIAGNOSIS — D0512 Intraductal carcinoma in situ of left breast: Secondary | ICD-10-CM

## 2019-04-29 IMAGING — MG 2D DIGITAL DIAGNOSTIC UNILATERAL LEFT MAMMOGRAM WITH CAD AND ADJ
8 of 12 series · 8 of 20 positions shown · non-contrast
Comparison: Previous exam(s).

CLINICAL DATA: Left breast masses seen on baseline screening
mammography. Patient has a history of left breast excisional
biopsies as a teenager, pathology unknown but believed to be benign.

EXAM:
2D DIGITAL DIAGNOSTIC LEFT MAMMOGRAM WITH CAD AND ADJUNCT TOMO
ULTRASOUND LEFT BREAST

[L MLO (1 of 4)]
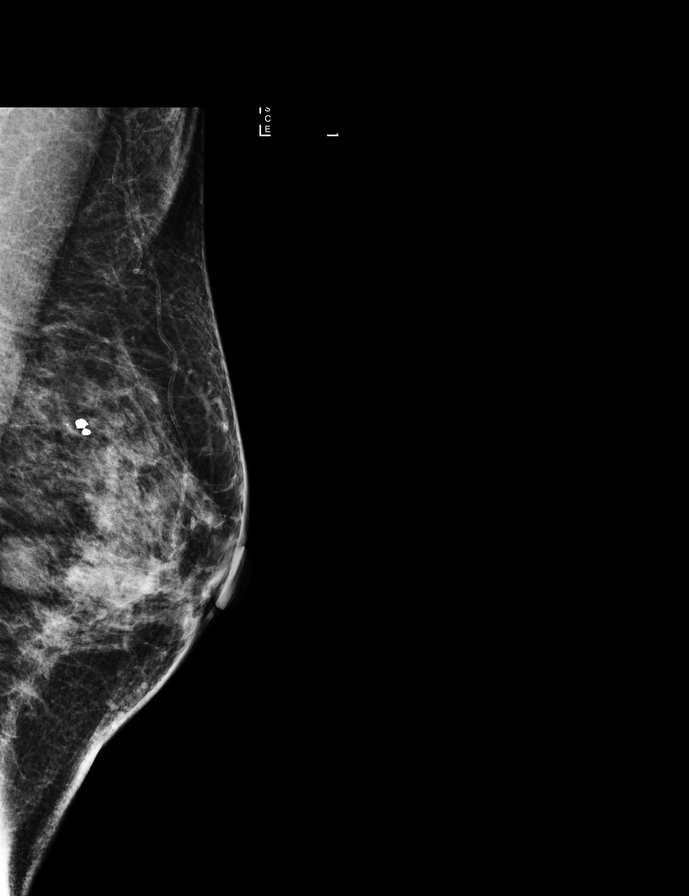

[L MLO synth-2D]
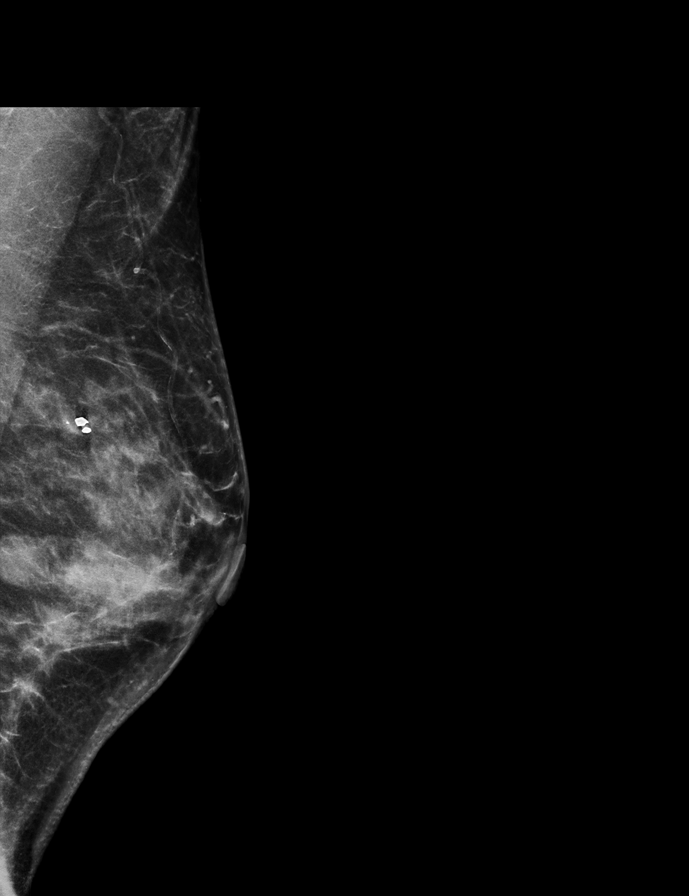

[L CC synth-2D]
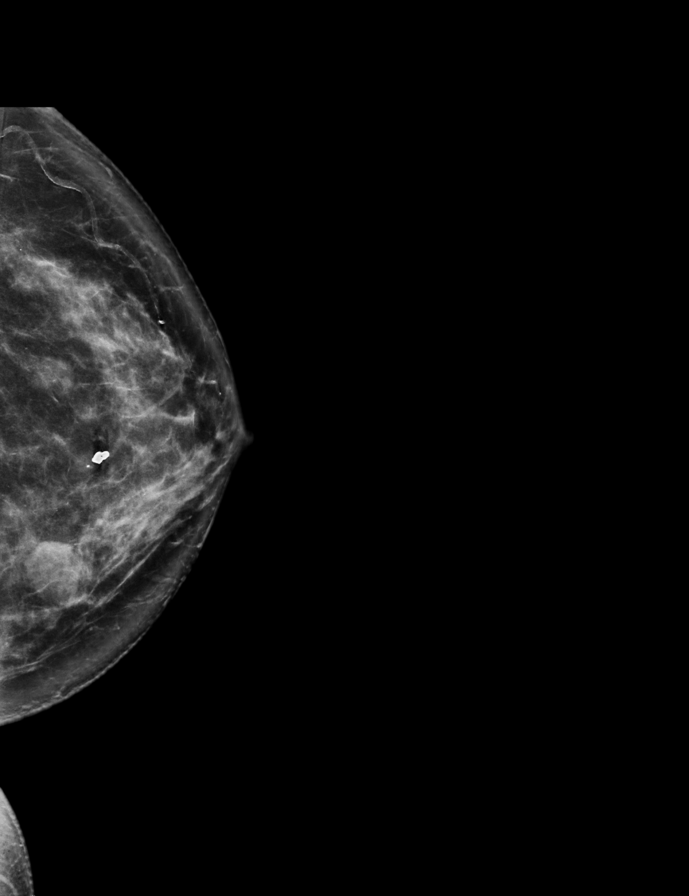

[L CC (1 of 2)]
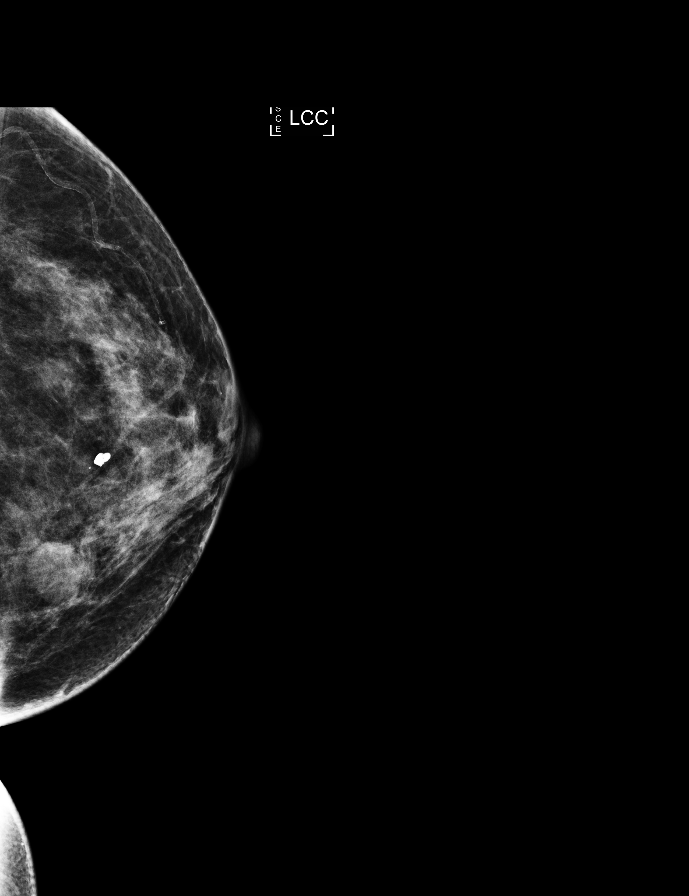

[L CC (2 of 2)]
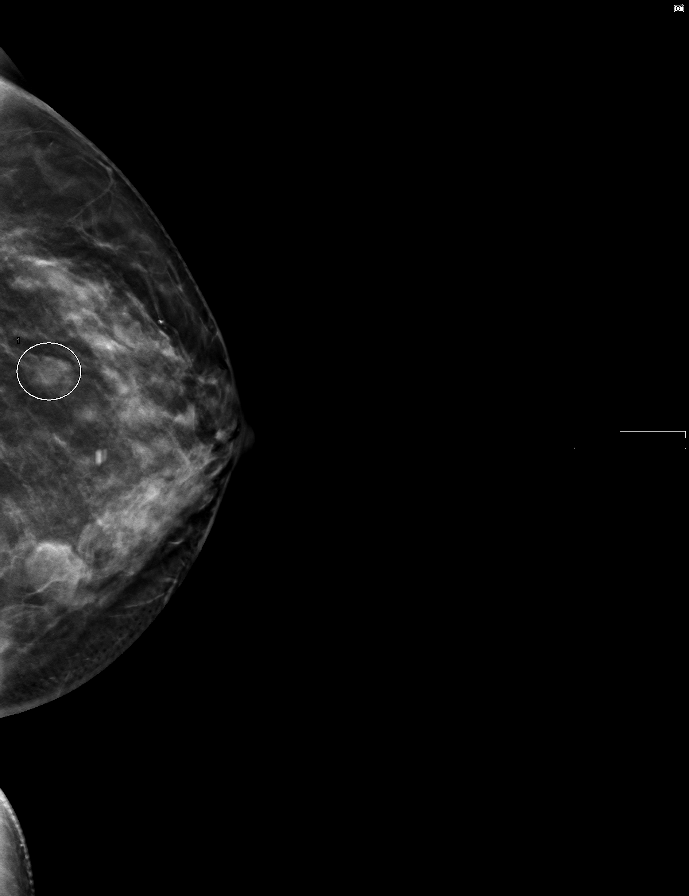

[L MLO (2 of 4)]
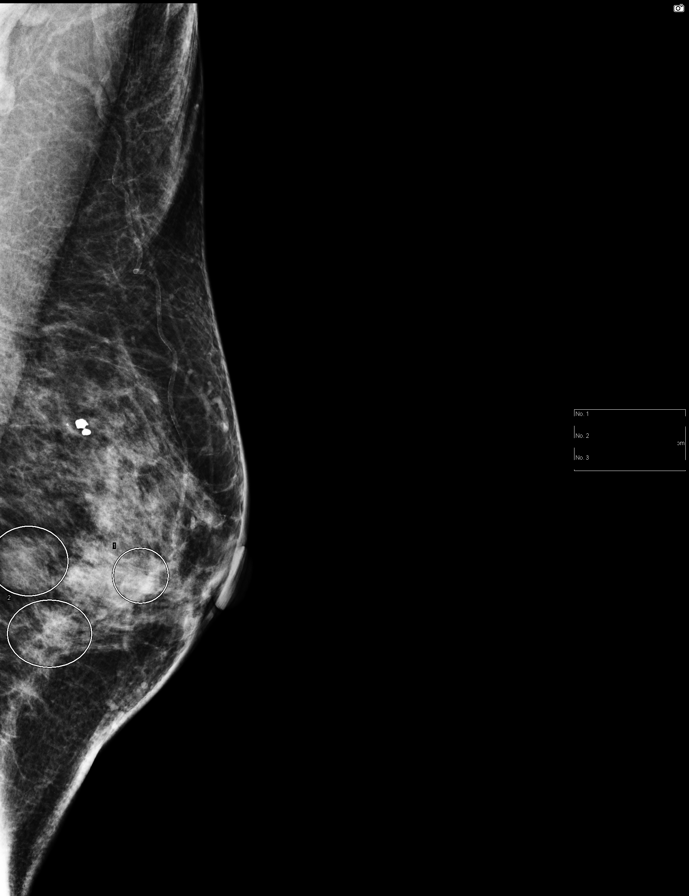

[L MLO (3 of 4)]
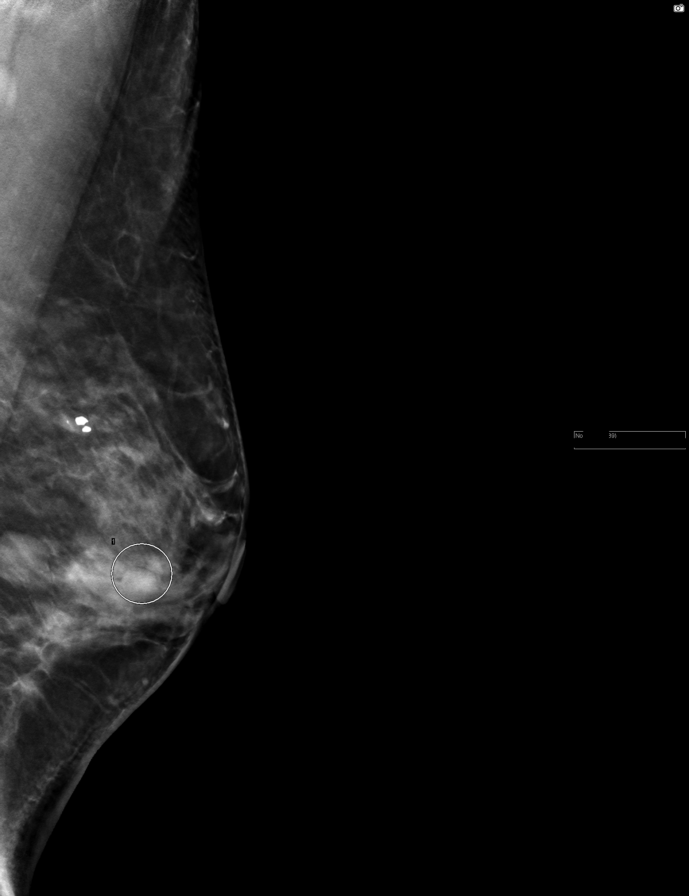

[L MLO (4 of 4)]
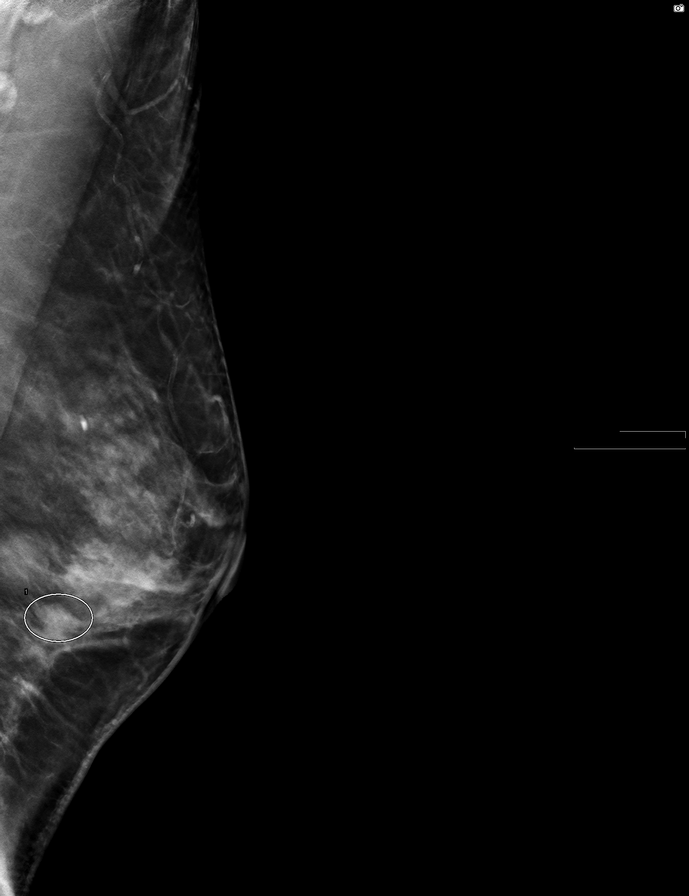

[8 of 20 positions shown; findings below may reference images not displayed]

ACR Breast Density Category c: The breast tissue is heterogeneously
dense, which may obscure small masses.
FINDINGS: Mammographically, there is a irregular mass in the left breast lower
outer quadrant, middle to posterior depth, circumscribed mass in the
left breast slightly lower inner quadrant, posterior depth. An
asymmetry is also seen in the subareolar left breast centrally.

Mammographic images were processed with CAD.

On physical exam, there is a moderately firm mass in the left 4
o'clock breast, and a firm mobile mass in the left 9 o'clock breast.

Targeted ultrasound is performed, showing left breast 9 o'clock 3 cm
from the nipple lobulated circumscribed hypoechoic mass which
measures 1.4 x 1.5 x 0.8 cm. No internal blood flow is seen. In the
left breast 4 o'clock 3 cm from the nipple, there is an irregular
hypoechoic mass, somewhat taller than wide, which measures 0.9 x
x 0.8 cm. In the subareolar left breast, there is a circumscribed
hypoechoic mass which measures 0.9 x 0.4 x 1.2 cm. No evidence of
left axillary lymphadenopathy.
IMPRESSION: Left breast 4 o'clock suspicious mass, for which ultrasound-guided
core needle biopsy is recommended.

Left subareolar breast indeterminate mass, for which
ultrasound-guided core needle biopsy is recommended.

Left 9 o'clock circumscribed mass, which may represent a
fibroadenoma. Six-month imaging follow-up is recommended.

RECOMMENDATION:
Ultrasound-guided core needle biopsy of left breast 4 o'clock mass
and left subareolar mass.

I have discussed the findings and recommendations with the patient.
Results were also provided in writing at the conclusion of the
visit. If applicable, a reminder letter will be sent to the patient
regarding the next appointment.

BI-RADS CATEGORY  4: Suspicious.

## 2019-05-19 ENCOUNTER — Ambulatory Visit
Admission: RE | Admit: 2019-05-19 | Discharge: 2019-05-19 | Disposition: A | Discharge status: Home / Self Care | Payer: BC Managed Care – PPO | Source: Ambulatory Visit | Attending: General Surgery | Admitting: General Surgery

## 2019-05-19 DIAGNOSIS — D0512 Intraductal carcinoma in situ of left breast: Secondary | ICD-10-CM

## 2019-05-30 ENCOUNTER — Ambulatory Visit: Payer: BC Managed Care – PPO | Attending: Internal Medicine

## 2019-05-30 DIAGNOSIS — Z23 Encounter for immunization: Secondary | ICD-10-CM

## 2019-05-30 NOTE — Progress Notes (Signed)
   Covid-19 Vaccination Clinic  Name:  KEERAT GAIER    MRN: [id number] DOB: [birth year]  05/30/2019  Ms. Roling was observed post Covid-19 immunization for 15 minutes without incident. She was provided with Vaccine Information Sheet and instruction to access the V-Safe system.   Ms. Roehr was instructed to call 911 with any severe reactions post vaccine: Marland Kitchen Difficulty breathing  . Swelling of face and throat  . A fast heartbeat  . A bad rash all over body  . Dizziness and weakness   Immunizations Administered    Name Date Dose VIS Date Route   Pfizer COVID-19 Vaccine 05/30/2019  9:35 AM 0.3 mL 02/14/2019 Intramuscular   Manufacturer: [place]   Lot: [id number]   [place]: [id number]

## 2019-06-23 ENCOUNTER — Ambulatory Visit: Payer: BC Managed Care – PPO | Attending: Internal Medicine

## 2019-06-23 DIAGNOSIS — Z23 Encounter for immunization: Secondary | ICD-10-CM

## 2019-06-23 NOTE — Progress Notes (Signed)
   Covid-19 Vaccination Clinic  Name:  Samantha West    MRN: [id number] DOB: 1965/12/18  06/23/2019  [name] was observed post Covid-19 immunization for 15 minutes without incident. She was provided with Vaccine Information Sheet and instruction to access the V-Safe system.   [name] was instructed to call 911 with any severe reactions post vaccine: Marland Kitchen Difficulty breathing  . Swelling of face and throat  . A fast heartbeat  . A bad rash all over body  . Dizziness and weakness   Immunizations Administered    Name Date Dose VIS Date Route   Pfizer COVID-19 Vaccine 06/23/2019  4:30 PM 0.3 mL 04/30/2018 Intramuscular   Manufacturer: [place]   Lot: [id number]   [place]: [id number]

## 2019-08-06 ENCOUNTER — Other Ambulatory Visit: Payer: Self-pay

## 2019-08-06 ENCOUNTER — Other Ambulatory Visit
Admission: RE | Admit: 2019-08-06 | Discharge: 2019-08-06 | Disposition: A | Discharge status: Home / Self Care | Payer: BC Managed Care – PPO | Source: Ambulatory Visit | Attending: General Surgery | Admitting: General Surgery

## 2019-08-06 DIAGNOSIS — Z20822 Contact with and (suspected) exposure to covid-19: Secondary | ICD-10-CM | POA: Insufficient documentation

## 2019-08-06 DIAGNOSIS — Z01812 Encounter for preprocedural laboratory examination: Secondary | ICD-10-CM | POA: Insufficient documentation

## 2019-08-06 LAB — SARS CORONAVIRUS 2 (TAT 6-24 HRS): SARS Coronavirus 2: NEGATIVE

## 2019-08-07 ENCOUNTER — Encounter: Payer: Self-pay | Admitting: General Surgery

## 2019-08-08 ENCOUNTER — Other Ambulatory Visit: Payer: Self-pay

## 2019-08-08 ENCOUNTER — Ambulatory Visit: Payer: BC Managed Care – PPO | Admitting: Certified Registered"

## 2019-08-08 ENCOUNTER — Encounter
Admission: RE | Disposition: A | Discharge status: Home / Self Care | Payer: Self-pay | Source: Home / Self Care | Attending: General Surgery

## 2019-08-08 ENCOUNTER — Ambulatory Visit
Admission: RE | Admit: 2019-08-08 | Discharge: 2019-08-08 | Disposition: A | Discharge status: Home / Self Care | Payer: BC Managed Care – PPO | Attending: General Surgery | Admitting: General Surgery

## 2019-08-08 ENCOUNTER — Encounter: Payer: Self-pay | Admitting: General Surgery

## 2019-08-08 DIAGNOSIS — Z888 Allergy status to other drugs, medicaments and biological substances status: Secondary | ICD-10-CM | POA: Insufficient documentation

## 2019-08-08 DIAGNOSIS — Z79899 Other long term (current) drug therapy: Secondary | ICD-10-CM | POA: Insufficient documentation

## 2019-08-08 DIAGNOSIS — Z86010 Personal history of colonic polyps: Secondary | ICD-10-CM | POA: Diagnosis not present

## 2019-08-08 DIAGNOSIS — Z1211 Encounter for screening for malignant neoplasm of colon: Secondary | ICD-10-CM | POA: Insufficient documentation

## 2019-08-08 DIAGNOSIS — Z79810 Long term (current) use of selective estrogen receptor modulators (SERMs): Secondary | ICD-10-CM | POA: Insufficient documentation

## 2019-08-08 DIAGNOSIS — Z7982 Long term (current) use of aspirin: Secondary | ICD-10-CM | POA: Diagnosis not present

## 2019-08-08 HISTORY — DX: Benign lipomatous neoplasm of skin and subcutaneous tissue of trunk: D17.1

## 2019-08-08 HISTORY — DX: Other specified diseases of liver: K76.89

## 2019-08-08 HISTORY — DX: Other seborrheic keratosis: L82.1

## 2019-08-08 HISTORY — DX: Decreased white blood cell count, unspecified: D72.819

## 2019-08-08 HISTORY — PX: COLONOSCOPY WITH PROPOFOL: SHX5780

## 2019-08-08 SURGERY — COLONOSCOPY WITH PROPOFOL
Anesthesia: General

## 2019-08-08 MED ORDER — PROPOFOL 500 MG/50ML IV EMUL
INTRAVENOUS | Status: DC | PRN
  Administered 2019-08-08: 120 ug/kg/min via INTRAVENOUS

## 2019-08-08 MED ORDER — LIDOCAINE HCL (CARDIAC) PF 100 MG/5ML IV SOSY
PREFILLED_SYRINGE | INTRAVENOUS | Status: DC | PRN
  Administered 2019-08-08: 30 mg via INTRAVENOUS

## 2019-08-08 MED ORDER — SODIUM CHLORIDE 0.9 % IV SOLN: INTRAVENOUS | Status: DC

## 2019-08-08 NOTE — Anesthesia Postprocedure Evaluation (Signed)
Anesthesia Post Note  Patient: Samantha West  Procedure(s) Performed: COLONOSCOPY WITH PROPOFOL (N/A )  Patient location during evaluation: Endoscopy Anesthesia Type: General Level of consciousness: awake and alert Pain management: pain level controlled Vital Signs Assessment: post-procedure vital signs reviewed and stable Respiratory status: spontaneous breathing, nonlabored ventilation, respiratory function stable and patient connected to nasal cannula oxygen Cardiovascular status: blood pressure returned to baseline and stable Postop Assessment: no apparent nausea or vomiting Anesthetic complications: no     Last Vitals:  Vitals:   08/08/19 1024 08/08/19 1151  BP: 131/86   Pulse: 83   Resp: 16   Temp: (!) 35.8 C (!) 35.7 C  SpO2: 100%     Last Pain:  Vitals:   08/08/19 1221  TempSrc:   PainSc: 0-No pain                 Precious Haws Stephana Morell

## 2019-08-08 NOTE — Op Note (Signed)
[facility] Gastroenterology Patient Name: Samantha West Procedure Date: 08/08/2019 11:14 AM MRN: [id number] Account #: [id number] Date of Birth: 1965/08/31 Admit Type: Outpatient Age: 54 Room: [facility] ENDO ROOM 1 Gender: Female Note Status: Finalized Procedure:             Colonoscopy Indications:           High risk colon cancer surveillance: Personal history                         of colonic polyps Providers:             Robert Bellow, MD Medicines:             Monitored Anesthesia Care Complications:         No immediate complications. Procedure:             Pre-Anesthesia Assessment:                        - Prior to the procedure, a History and Physical was                         performed, and patient medications, allergies and                         sensitivities were reviewed. The patient's tolerance                         of previous anesthesia was reviewed.                        - The risks and benefits of the procedure and the                         sedation options and risks were discussed with the                         patient. All questions were answered and informed                         consent was obtained.                        After obtaining informed consent, the colonoscope was                         passed under direct vision. Throughout the procedure,                         the patient's blood pressure, pulse, and oxygen                         saturations were monitored continuously. The                         Colonoscope was introduced through the anus and                         advanced to the the cecum, identified by appendiceal  orifice and ileocecal valve. The colonoscopy was                         somewhat difficult due to restricted mobility of the                         colon. The patient tolerated the procedure well. The                         quality of the bowel preparation was  excellent. Findings:      The entire examined colon appeared normal on direct and retroflexion       views. Impression:            - The entire examined colon is normal on direct and                         retroflexion views.                        - No specimens collected. Recommendation:        - Repeat colonoscopy in 5 years for surveillance. Procedure Code(s):     --- Professional ---                        [phone], Colonoscopy, flexible; diagnostic, including                         collection of specimen(s) by brushing or washing, when                         performed (separate procedure) CPT copyright 2019 American Medical Association. All rights reserved. The codes documented in this report are preliminary and upon coder review may  be revised to meet current compliance requirements. Robert Bellow, MD 08/08/2019 11:50:37 AM This report has been signed electronically. Number of Addenda: 0 Note Initiated On: 08/08/2019 11:14 AM Scope Withdrawal Time: 0 hours 7 minutes 14 seconds  Total Procedure Duration: 0 hours 20 minutes 11 seconds  Estimated Blood Loss:  Estimated blood loss: none.      [facility]

## 2019-08-08 NOTE — Anesthesia Preprocedure Evaluation (Signed)
Anesthesia Evaluation  Patient identified by MRN, date of birth, ID band Patient awake    Reviewed: Allergy & Precautions, H&P , NPO status , Patient's Chart, lab work & pertinent test results  History of Anesthesia Complications Negative for: history of anesthetic complications  Airway Mallampati: II  TM Distance: >3 FB Neck ROM: full    Dental  (+) Chipped   Pulmonary neg pulmonary ROS, neg shortness of breath,    Pulmonary exam normal        Cardiovascular Exercise Tolerance: Good (-) angina(-) Past MI and (-) DOE negative cardio ROS Normal cardiovascular exam     Neuro/Psych  Headaches, negative psych ROS   GI/Hepatic Neg liver ROS, hiatal hernia, GERD  Medicated and Controlled,  Endo/Other  negative endocrine ROS  Renal/GU negative Renal ROS  negative genitourinary   Musculoskeletal   Abdominal   Peds  Hematology negative hematology ROS (+)   Anesthesia Other Findings Past Medical History: No date: Abnormal Pap smear of cervix     Comment:  -1980s hx of colposcopy and cryotherapy to cervix--paps               normal since 2014: Anemia     Comment:  prior to Hysterectomy--because of fibroid 02/2017: Cancer ([place])     Comment:  DCIS--Breast CA- Left No date: Fibroid No date: History of hiatal hernia     Comment:  small No date: Leukopenia No date: Lipoma of back No date: Migraine     Comment:  h/o migraines prior to hysterectomy No date: Other specified diseases of liver 2019: Personal history of radiation therapy     Comment:  Left breast No date: Seborrheic keratoses  Past Surgical History: 2014: ABDOMINAL HYSTERECTOMY 1986: APPENDECTOMY 03/29/2017: BREAST BIOPSY; Left     Comment:  4:00 coil clip DCIS 03/29/2017: BREAST BIOPSY; Left     Comment:  subareolar wing clip- fibroadenoma 04/11/2017: BREAST BIOPSY; Left     Comment:  9:00 ribbon clip-fibroadenoma 1982: BREAST CYST EXCISION; Left   Comment:  3 cyst removed 1982: BREAST CYST EXCISION; Right     Comment:  4 cyst removed 05/16/2017: BREAST LUMPECTOMY; Left     Comment:  Wide excision 12 mm area high grade DCIS, 1 mm anterior               margin(subcutaneous fat). ER 51-90%/ PR 1-10%.  Surgeon:               Robert Bellow, MD;  Location: [facility] ORS;  Service:               General;  Laterality: Left; No date: CESAREAN SECTION     Comment:  x 3 12/29/2015: COLONOSCOPY W/ BIOPSIES     Comment:  Tubular adenoma without Tomasa Hosteller, [place],. 10/08/2012: CYSTOSCOPY; N/A     Comment:  Procedure: CYSTOSCOPY;  Surgeon: Azalia Bilis, MD;                Location: [place] [facility];  Service: Gynecology;  Laterality: N/A; No date: ESOPHAGOGASTRODUODENOSCOPY 08/02/2018: ESOPHAGOGASTRODUODENOSCOPY (EGD) WITH PROPOFOL; N/A     Comment:  Procedure: ESOPHAGOGASTRODUODENOSCOPY (EGD) WITH               PROPOFOL;  Surgeon: Lollie Sails, MD;  Location:               [facility] ENDOSCOPY;  Service: Endoscopy;  Laterality: N/A; 10/08/2012: ROBOTIC ASSISTED TOTAL HYSTERECTOMY; N/A     Comment:  Procedure: ROBOTIC ASSISTED TOTAL  HYSTERECTOMY WITH               BILATERAL SALPINGECTOMY;  Surgeon: Azalia Bilis, MD;                Location: [place] [facility];  Service: Gynecology;  Laterality: N/A; No date: TUBAL LIGATION  BMI    Body Mass Index: 24.69 kg/m      Reproductive/Obstetrics negative OB ROS                             Anesthesia Physical Anesthesia Plan  ASA: II  Anesthesia Plan: General   Post-op Pain Management:    Induction: Intravenous  PONV Risk Score and Plan: Propofol infusion and TIVA  Airway Management Planned: Natural Airway and Nasal Cannula  Additional Equipment:   Intra-op Plan:   Post-operative Plan:   Informed Consent: I have reviewed the patients History and Physical, chart, labs and discussed the procedure including the risks, benefits and alternatives for the proposed  anesthesia with the patient or authorized representative who has indicated his/her understanding and acceptance.     Dental Advisory Given  Plan Discussed with: Anesthesiologist, CRNA and Surgeon  Anesthesia Plan Comments: (Patient consented for risks of anesthesia including but not limited to:  - adverse reactions to medications - risk of intubation if required - damage to eyes, teeth, lips or other oral mucosa - nerve damage due to positioning  - sore throat or hoarseness - Damage to heart, brain, nerves, lungs, other parts of body or loss of life  Patient voiced understanding.)        Anesthesia Quick Evaluation

## 2019-08-08 NOTE — H&P (Signed)
Samantha West [id number] Jul 22, 1965     HPI:  Healthy 54 y/o woman with history of adenomatous polyp of the ascending colon in 2017. For f/u exam. Tolerated prep well.   Medications Prior to Admission  Medication Sig Dispense Refill Last Dose  . acetaminophen (TYLENOL) 500 MG tablet Take 500 mg by mouth every 6 (six) hours as needed (FOR PAIN.).   Past Week at Unknown time  . Multiple Vitamin (MULTIVITAMIN WITH MINERALS) TABS tablet Take 1 tablet by mouth 3 (three) times a week. 2-3 TIMES A WEEK   Past Week at Unknown time  . zolpidem (AMBIEN) 5 MG tablet    Past Week at Unknown time  . aspirin EC 81 MG tablet Take 81 mg by mouth daily.   08/06/2019  . Aspirin-Salicylamide-Caffeine (BC FAST PAIN RELIEF) 650-195-33.3 MG PACK Take 1 packet by mouth daily as needed (FOR PAIN.). BC POWDERS.     . fluconazole (DIFLUCAN) 150 MG tablet Take 150 mg by mouth daily.     Marland Kitchen lidocaine-hydrocortisone (ANAMANTLE) 3-1 % KIT Place 1 application rectally 2 (two) times daily.     Marland Kitchen nystatin-triamcinolone (MYCOLOG II) cream Apply 1 application topically 2 (two) times daily. Apply to affected area BID for up to 7 days. 60 g 0   . ondansetron (ZOFRAN ODT) 4 MG disintegrating tablet Take 1 tablet (4 mg total) by mouth every 8 (eight) hours as needed for nausea or vomiting. 20 tablet 0   . pantoprazole (PROTONIX) 40 MG tablet Take 40 mg by mouth daily.     . sucralfate (CARAFATE) 1 GM/10ML suspension Take 0.1 g by mouth 4 (four) times daily -  with meals and at bedtime.     . tamoxifen (NOLVADEX) 20 MG tablet TAKE 1 TABLET BY MOUTH EVERY DAY 30 tablet 12 08/06/2019   Allergies  Allergen Reactions  . Doxepin Other (See Comments)    jittery   Past Medical History:  Diagnosis Date  . Abnormal Pap smear of cervix    -1980s hx of colposcopy and cryotherapy to cervix--paps normal since  . Anemia 2014   prior to Hysterectomy--because of fibroid  . Cancer ([place]) 02/2017   DCIS--Breast CA- Left  . Fibroid   . History  of hiatal hernia    small  . Leukopenia   . Lipoma of back   . Migraine    h/o migraines prior to hysterectomy  . Other specified diseases of liver   . Personal history of radiation therapy 2019   Left breast  . Seborrheic keratoses    Past Surgical History:  Procedure Laterality Date  . ABDOMINAL HYSTERECTOMY  2014  . APPENDECTOMY  1986  . 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 BREAST BIOPSY Left 03/29/2017   subareolar wing clip- fibroadenoma  . BREAST BIOPSY Left 04/11/2017   9:00 ribbon clip-fibroadenoma  . BREAST CYST EXCISION Left 1982   3 cyst removed  . BREAST CYST EXCISION Right 1982   4 cyst removed  . BREAST LUMPECTOMY Left 05/16/2017   Wide excision 12 mm area high grade DCIS, 1 mm anterior margin(subcutaneous fat). ER 51-90%/ PR 1-10%.  Surgeon: Robert Bellow, MD;  Location: [facility] ORS;  Service: General;  Laterality: Left;  . CESAREAN SECTION     x 3  . COLONOSCOPY W/ BIOPSIES  12/29/2015   Tubular adenoma without Tomasa Hosteller, ,.  . CYSTOSCOPY N/A 10/08/2012   Procedure: CYSTOSCOPY;  Surgeon: Azalia Bilis, MD;  Location:  [facility];  Service: Gynecology;  Laterality: N/A;  . ESOPHAGOGASTRODUODENOSCOPY    . ESOPHAGOGASTRODUODENOSCOPY (EGD) WITH PROPOFOL N/A 08/02/2018   Procedure: ESOPHAGOGASTRODUODENOSCOPY (EGD) WITH PROPOFOL;  Surgeon: Lollie Sails, MD;  Location: [facility] ENDOSCOPY;  Service: Endoscopy;  Laterality: N/A;  . ROBOTIC ASSISTED TOTAL HYSTERECTOMY N/A 10/08/2012   Procedure: ROBOTIC ASSISTED TOTAL HYSTERECTOMY WITH BILATERAL SALPINGECTOMY;  Surgeon: Azalia Bilis, MD;  Location: [facility];  Service: Gynecology;  Laterality: N/A;  . TUBAL LIGATION     Social History   Socioeconomic History  . Marital status: Divorced    Spouse name: Not on file  . Number of children: 3  . Years of education: Not on file  . Highest education level: Not on file  Occupational History    Employer: [employer]  Tobacco Use  .  Smoking status: Never Smoker  . Smokeless tobacco: Never Used  Substance and Sexual Activity  . Alcohol use: Yes    Alcohol/week: 1.0 standard drinks    Types: 1 Glasses of wine per week    Comment: none last 24hrs  . Drug use: No  . Sexual activity: Yes    Partners: Male    Birth control/protection: Surgical    Comment: Hysterectomy--ovaries remain  Other Topics Concern  . Not on file  Social History Narrative  . Not on file   Social Determinants of Health   Financial Resource Strain:   . Difficulty of Paying Living Expenses:   Food Insecurity:   . Worried About Charity fundraiser in the Last Year:   . Arboriculturist in the Last Year:   Transportation Needs:   . Film/video editor (Medical):   Marland Kitchen Lack of Transportation (Non-Medical):   Physical Activity:   . Days of Exercise per Week:   . Minutes of Exercise per Session:   Stress:   . Feeling of Stress :   Social Connections:   . Frequency of Communication with Friends and Family:   . Frequency of Social Gatherings with Friends and Family:   . Attends Religious Services:   . Active Member of Clubs or Organizations:   . Attends Archivist Meetings:   Marland Kitchen Marital Status:   Intimate Partner Violence:   . Fear of Current or Ex-Partner:   . Emotionally Abused:   Marland Kitchen Physically Abused:   . Sexually Abused:    Social History   Social History Narrative  . Not on file     ROS: Negative.     PE: HEENT: Negative. Lungs: Clear. Cardio: RR.   Assessment/Plan:  Proceed with planned endoscopy.  Forest Gleason [facility] 08/08/2019

## 2019-08-08 NOTE — Transfer of Care (Signed)
Immediate Anesthesia Transfer of Care Note  Patient: Samantha West  Procedure(s) Performed: COLONOSCOPY WITH PROPOFOL (N/A )  Patient Location: PACU and Endoscopy Unit  Anesthesia Type:General  Level of Consciousness: sedated  Airway & Oxygen Therapy: Patient Spontanous Breathing  Post-op Assessment: Report given to RN  Post vital signs: stable  Last Vitals:  Vitals Value Taken Time  BP    Temp    Pulse    Resp    SpO2      Last Pain:  Vitals:   08/08/19 1024  TempSrc: Temporal  PainSc: 0-No pain         Complications: No apparent anesthesia complications

## 2019-08-11 ENCOUNTER — Encounter: Payer: Self-pay | Admitting: *Deleted

## 2019-08-27 ENCOUNTER — Ambulatory Visit
Admission: RE | Admit: 2019-08-27 | Discharge: 2019-08-27 | Disposition: A | Discharge status: Home / Self Care | Payer: BC Managed Care – PPO | Source: Ambulatory Visit | Attending: Radiation Oncology | Admitting: Radiation Oncology

## 2019-08-27 ENCOUNTER — Other Ambulatory Visit: Payer: Self-pay

## 2019-08-27 DIAGNOSIS — Z923 Personal history of irradiation: Secondary | ICD-10-CM | POA: Insufficient documentation

## 2019-08-27 DIAGNOSIS — D0512 Intraductal carcinoma in situ of left breast: Secondary | ICD-10-CM | POA: Insufficient documentation

## 2019-08-27 DIAGNOSIS — Z170 Estrogen receptor positive status [ER+]: Secondary | ICD-10-CM | POA: Diagnosis not present

## 2019-08-27 DIAGNOSIS — Z79810 Long term (current) use of selective estrogen receptor modulators (SERMs): Secondary | ICD-10-CM | POA: Insufficient documentation

## 2019-08-27 NOTE — Progress Notes (Signed)
Radiation Oncology Follow up Note  Name: Samantha West   Date:   08/27/2019 MRN:  [id number] DOB: Jan 16, 1966    This 54 y.o. female presents to the clinic today for 2-year follow-up status post whole breast radiation to her left breast for ER/PR positive ductal carcinoma in situ.  REFERRING PROVIDER: Sharyne Peach, MD  HPI: Patient is a 54 year old female now out 2 years having completed whole breast radiation to her left breast for ER/PR positive ductal carcinoma in situ.  Seen today in routine follow-up she is doing well.  She specifically denies breast tenderness cough or bone pain..  She had mammograms back in March which were BI-RADS 2 benign which I have reviewed.  She is currently on tamoxifen tolerating that well without side effect.  COMPLICATIONS OF TREATMENT: none  FOLLOW UP COMPLIANCE: keeps appointments   PHYSICAL EXAM:  BP (P) 127/74 (BP Location: Left Arm, Patient Position: Sitting)    Pulse (P) 62    Resp (P) 16    Wt (P) 135 lb 9.6 oz (61.5 kg)    LMP 08/25/2012    BMI (P) 24.80 kg/m  Lungs are clear to A&P cardiac examination essentially unremarkable with regular rate and rhythm. No dominant mass or nodularity is noted in either breast in 2 positions examined. Incision is well-healed. No axillary or supraclavicular adenopathy is appreciated. Cosmetic result is excellent.  Well-developed well-nourished patient in NAD. HEENT reveals PERLA, EOMI, discs not visualized.  Oral cavity is clear. No oral mucosal lesions are identified. Neck is clear without evidence of cervical or supraclavicular adenopathy. Lungs are clear to A&P. Cardiac examination is essentially unremarkable with regular rate and rhythm without murmur rub or thrill. Abdomen is benign with no organomegaly or masses noted. Motor sensory and DTR levels are equal and symmetric in the upper and lower extremities. Cranial nerves II through XII are grossly intact. Proprioception is intact. No peripheral adenopathy or  edema is identified. No motor or sensory levels are noted. Crude visual fields are within normal range.  RADIOLOGY RESULTS: Mammograms reviewed compatible with above-stated findings  PLAN: Present time patient is doing well 2 years out with no evidence of disease.  And pleased with her overall progress.  She continues on tamoxifen without side effect.  Of asked to see her back in 1 year for follow-up.  Patient knows to call with any concerns at any time.  I would like to take this opportunity to thank you for allowing me to participate in the care of your patient.Noreene Filbert, MD

## 2019-09-04 ENCOUNTER — Telehealth: Payer: Self-pay

## 2019-09-04 NOTE — Telephone Encounter (Signed)
Patient called to reschedule doctor canceled annual from (01/24/19). Offered patient next available (12/08/19). Patient states she would like to be seen sooner.

## 2019-09-04 NOTE — Telephone Encounter (Signed)
Call returned to patient, left detailed message, ok per dpr. Advised AEX moved to earlier date, 11/19/19 at 8am with Dr. Quincy Simmonds. Please return call to office and ask to speak with a triage nurse if you are experiencing any GYN symptoms or concerns.   Routing to provider for final review. Patient is agreeable to disposition. Will close encounter.

## 2019-11-12 NOTE — Progress Notes (Signed)
54 y.o. [id number] Divorced [place] American female here for annual exam.    Using [employer] now.   On Tamoxifen.   Recently bitten by fire ants.   Completed Covid vaccine in April, 2021.   PCP: Salome Holmes, MD    Patient's last menstrual period was 08/25/2012.           Sexually active: Yes.    The current method of family planning is status post hysterectomy--ovaries remain.    Exercising: Yes.    walks all day at work--10,000 steps Smoker:  no  Health Maintenance: Pap: 11-12-14 Neg:Neg HR HPV History of abnormal Pap:  Yes, Hx cryotherapy to cervix 1980's--paps normal since. MMG: 05-19-19 Diag.Bil/stable lt.br.lumpectomy site/Neg/BiRads2/Diag.MMG 15yr Colonoscopy: 2017 benign polyp;next 2027  BMD:  To schedule  Result :PCP recommended TDaP:  11-12-14 Gardasil:   no HIV:no Hep C:no Screening labs:  PCP.    reports that she has never smoked. She has never used smokeless tobacco. She reports previous alcohol use. She reports that she does not use drugs.  Past Medical History:  Diagnosis Date  . Abnormal Pap smear of cervix    -1980s hx of colposcopy and cryotherapy to cervix--paps normal since  . Anemia 2014   prior to Hysterectomy--because of fibroid  . Cancer ([place]) 02/2017   DCIS--Breast CA- Left  . Fibroid   . History of hiatal hernia    small  . Leukopenia   . Lipoma of back   . Migraine    h/o migraines prior to hysterectomy  . Other specified diseases of liver   . Personal history of radiation therapy 2019   Left breast  . Seborrheic keratoses     Past Surgical History:  Procedure Laterality Date  . ABDOMINAL HYSTERECTOMY  2014  . APPENDECTOMY  1986  . 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 BREAST BIOPSY Left 03/29/2017   subareolar wing clip- fibroadenoma  . BREAST BIOPSY Left 04/11/2017   9:00 ribbon clip-fibroadenoma  . BREAST CYST EXCISION Left 1982   3 cyst removed  . BREAST CYST EXCISION Right 1982   4 cyst removed  . BREAST  LUMPECTOMY Left 05/16/2017   Wide excision 12 mm area high grade DCIS, 1 mm anterior margin(subcutaneous fat). ER 51-90%/ PR 1-10%.  Surgeon: Robert Bellow, MD;  Location: [facility] ORS;  Service: General;  Laterality: Left;  . CESAREAN SECTION     x 3  . COLONOSCOPY W/ BIOPSIES  12/29/2015   Tubular adenoma without atypica, Verdia Kuba, [place],.  . COLONOSCOPY WITH PROPOFOL N/A 08/08/2019   Procedure: COLONOSCOPY WITH PROPOFOL;  Surgeon: Robert Bellow, MD;  Location: [facility] ENDOSCOPY;  Service: Endoscopy;  Laterality: N/A;  . CYSTOSCOPY N/A 10/08/2012   Procedure: CYSTOSCOPY;  Surgeon: Azalia Bilis, MD;  Location: [facility];  Service: Gynecology;  Laterality: N/A;  . ESOPHAGOGASTRODUODENOSCOPY    . ESOPHAGOGASTRODUODENOSCOPY (EGD) WITH PROPOFOL N/A 08/02/2018   Procedure: ESOPHAGOGASTRODUODENOSCOPY (EGD) WITH PROPOFOL;  Surgeon: Lollie Sails, MD;  Location: [facility] ENDOSCOPY;  Service: Endoscopy;  Laterality: N/A;  . ROBOTIC ASSISTED TOTAL HYSTERECTOMY N/A 10/08/2012   Procedure: ROBOTIC ASSISTED TOTAL HYSTERECTOMY WITH BILATERAL SALPINGECTOMY;  Surgeon: Azalia Bilis, MD;  Location: [facility];  Service: Gynecology;  Laterality: N/A;  . TUBAL LIGATION      Current Outpatient Medications  Medication Sig Dispense Refill  . acetaminophen (TYLENOL) 500 MG tablet Take 500 mg by mouth every 6 (six) hours as needed (FOR PAIN.).    Marland Kitchen  aspirin EC 81 MG tablet Take 81 mg by mouth daily.    . Multiple Vitamin (MULTIVITAMIN WITH MINERALS) TABS tablet Take 1 tablet by mouth 3 (three) times a week. 2-3 TIMES A WEEK    . tamoxifen (NOLVADEX) 20 MG tablet TAKE 1 TABLET BY MOUTH EVERY DAY 30 tablet 12  . zolpidem (AMBIEN) 5 MG tablet Take 1 tablet by mouth at bedtime.     No current facility-administered medications for this visit.    Family History  Problem Relation Age of Onset  . Hypertension Mother   . Heart failure Mother   . Stroke Mother        Dec d/t complications of stroke at age 19  .  Alzheimer's disease Father   . Diabetes Sister   . Hypertension Sister   . Diabetes Paternal Grandmother   . Breast cancer Neg Hx     Review of Systems  All other systems reviewed and are negative.   Exam:   BP 104/72   Pulse 64   Resp 14   Ht 5\' 2"  (1.575 m)   Wt 134 lb (60.8 kg)   LMP 08/25/2012   BMI 24.51 kg/m     General appearance: alert, cooperative and appears stated age Head: normocephalic, without obvious abnormality, atraumatic Neck: no adenopathy, supple, symmetrical, trachea midline and thyroid normal to inspection and palpation Lungs: clear to auscultation bilaterally Breasts: normal appearance on right and well healed scar of left breast, no masses or tenderness, No nipple retraction or dimpling, No nipple discharge or bleeding, No axillary adenopathy Heart: regular rate and rhythm Abdomen: soft, non-tender; no masses, no organomegaly Extremities: extremities normal, atraumatic, no cyanosis or edema Skin: skin color, texture, turgor normal. No rashes or lesions Lymph nodes: cervical, supraclavicular, and axillary nodes normal. Neurologic: grossly normal  Pelvic: External genitalia:  no lesions              No abnormal inguinal nodes palpated.              Urethra:  normal appearing urethra with no masses, tenderness or lesions              Bartholins and Skenes: normal                 Vagina: normal appearing vagina with normal color and discharge, no lesions              Cervix: absent              Pap taken: No. Bimanual Exam:  Uterus:  absent              Adnexa: no mass, fullness, tenderness              Rectal exam: Yes.  .  Confirms.              Anus:  normal sphincter tone, no lesions  Chaperone was present for exam.  Assessment:   Well woman visit with normal exam. Status post hysterectomy.  Ovaries remain.  Hx left breast DCIS.  On Tamoxifen, since March 2019.   Plan: Mammogram screening discussed. Self breast awareness reviewed. Pap and  HR HPV as above. Guidelines for Calcium, Vitamin D, regular exercise program including cardiovascular and weight bearing exercise. BMD ordered through PCP.   We discussed bone densities and risk factors for osteoporosis.  Follow up annually and prn.   After visit summary provided.

## 2019-11-19 ENCOUNTER — Encounter: Payer: Self-pay | Admitting: Obstetrics and Gynecology

## 2019-11-19 ENCOUNTER — Other Ambulatory Visit: Payer: Self-pay

## 2019-11-19 ENCOUNTER — Ambulatory Visit: Payer: BC Managed Care – PPO | Admitting: Obstetrics and Gynecology

## 2019-11-19 VITALS — BP 104/72 | HR 64 | Resp 14 | Ht 62.0 in | Wt 134.0 lb

## 2019-11-19 DIAGNOSIS — Z01419 Encounter for gynecological examination (general) (routine) without abnormal findings: Secondary | ICD-10-CM | POA: Diagnosis not present

## 2019-11-19 NOTE — Patient Instructions (Signed)

## 2019-12-08 ENCOUNTER — Ambulatory Visit: Payer: BLUE CROSS/BLUE SHIELD | Admitting: Obstetrics and Gynecology

## 2020-04-15 ENCOUNTER — Other Ambulatory Visit: Payer: Self-pay | Admitting: Family Medicine

## 2020-04-15 DIAGNOSIS — Z1231 Encounter for screening mammogram for malignant neoplasm of breast: Secondary | ICD-10-CM

## 2020-04-16 ENCOUNTER — Other Ambulatory Visit: Payer: Self-pay | Admitting: General Surgery

## 2020-04-16 DIAGNOSIS — D0512 Intraductal carcinoma in situ of left breast: Secondary | ICD-10-CM

## 2020-05-20 ENCOUNTER — Ambulatory Visit
Admission: RE | Admit: 2020-05-20 | Discharge: 2020-05-20 | Disposition: A | Discharge status: Home / Self Care | Payer: BC Managed Care – PPO | Source: Ambulatory Visit | Attending: Family Medicine | Admitting: Family Medicine

## 2020-05-20 ENCOUNTER — Other Ambulatory Visit: Payer: Self-pay

## 2020-05-20 DIAGNOSIS — Z1231 Encounter for screening mammogram for malignant neoplasm of breast: Secondary | ICD-10-CM | POA: Insufficient documentation

## 2020-07-27 ENCOUNTER — Emergency Department
Admission: EM | Admit: 2020-07-27 | Discharge: 2020-07-27 | Disposition: A | Discharge status: Home / Self Care | Payer: BC Managed Care – PPO | Attending: Emergency Medicine | Admitting: Emergency Medicine

## 2020-07-27 ENCOUNTER — Other Ambulatory Visit: Payer: Self-pay

## 2020-07-27 ENCOUNTER — Encounter: Payer: Self-pay | Admitting: Emergency Medicine

## 2020-07-27 ENCOUNTER — Emergency Department: Payer: BC Managed Care – PPO

## 2020-07-27 DIAGNOSIS — Z79899 Other long term (current) drug therapy: Secondary | ICD-10-CM | POA: Diagnosis not present

## 2020-07-27 DIAGNOSIS — Z853 Personal history of malignant neoplasm of breast: Secondary | ICD-10-CM | POA: Insufficient documentation

## 2020-07-27 DIAGNOSIS — M79661 Pain in right lower leg: Secondary | ICD-10-CM | POA: Insufficient documentation

## 2020-07-27 DIAGNOSIS — Z7982 Long term (current) use of aspirin: Secondary | ICD-10-CM | POA: Diagnosis not present

## 2020-07-27 DIAGNOSIS — M79662 Pain in left lower leg: Secondary | ICD-10-CM | POA: Diagnosis not present

## 2020-07-27 DIAGNOSIS — R252 Cramp and spasm: Secondary | ICD-10-CM

## 2020-07-27 LAB — BASIC METABOLIC PANEL
Anion gap: 9 (ref 5–15)
BUN: 18 mg/dL (ref 6–20)
CO2: 26 mmol/L (ref 22–32)
Calcium: 8.9 mg/dL (ref 8.9–10.3)
Chloride: 105 mmol/L (ref 98–111)
Creatinine, Ser: 0.91 mg/dL (ref 0.44–1.00)
GFR, Estimated: >60 mL/min (ref >60)
Glucose, Bld: 109 mg/dL — ABNORMAL HIGH (ref 70–99)
Potassium: 4.4 mmol/L (ref 3.5–5.1)
Sodium: 140 mmol/L (ref 135–145)

## 2020-07-27 LAB — CBC WITH DIFFERENTIAL/PLATELET
Abs Immature Granulocytes: 0 10*3/uL (ref 0.00–0.07)
Basophils Absolute: 0 10*3/uL (ref 0.0–0.1)
Basophils Relative: 1 %
Eosinophils Absolute: 0.1 10*3/uL (ref 0.0–0.5)
Eosinophils Relative: 3 %
HCT: 35.9 % — ABNORMAL LOW (ref 36.0–46.0)
Hemoglobin: 11.6 g/dL — ABNORMAL LOW (ref 12.0–15.0)
Immature Granulocytes: 0 %
Lymphocytes Relative: 44 %
Lymphs Abs: 1.6 10*3/uL (ref 0.7–4.0)
MCH: 27.8 pg (ref 26.0–34.0)
MCHC: 32.3 g/dL (ref 30.0–36.0)
MCV: 86.1 fL (ref 80.0–100.0)
Monocytes Absolute: 0.3 10*3/uL (ref 0.1–1.0)
Monocytes Relative: 9 %
Neutro Abs: 1.6 10*3/uL — ABNORMAL LOW (ref 1.7–7.7)
Neutrophils Relative %: 43 %
Platelets: 164 10*3/uL (ref 150–400)
RBC: 4.17 MIL/uL (ref 3.87–5.11)
RDW: 13.9 % (ref 11.5–15.5)
WBC: 3.6 10*3/uL — ABNORMAL LOW (ref 4.0–10.5)
nRBC: 0 % (ref 0.0–0.2)

## 2020-07-27 MED ORDER — ORPHENADRINE CITRATE ER 100 MG PO TB12: 100.0000 mg | ORAL_TABLET | Freq: Two times a day (BID) | ORAL | 0 refills | Status: DC

## 2020-07-27 MED ORDER — ORPHENADRINE CITRATE 30 MG/ML IJ SOLN
60.0000 mg | Freq: Two times a day (BID) | INTRAMUSCULAR | Status: DC
  Administered 2020-07-27: 60 mg via INTRAMUSCULAR
  Filled 2020-07-27: qty 2

## 2020-07-27 MED ORDER — NAPROXEN 500 MG PO TABS
500.0000 mg | ORAL_TABLET | Freq: Once | ORAL | Status: AC
  Administered 2020-07-27: 500 mg via ORAL
  Filled 2020-07-27: qty 1

## 2020-07-27 MED ORDER — NAPROXEN 500 MG PO TABS: 500.0000 mg | ORAL_TABLET | Freq: Two times a day (BID) | ORAL | 0 refills | Status: DC

## 2020-07-27 NOTE — Discharge Instructions (Signed)
Read and follow discharge care instructions. 

## 2020-07-27 NOTE — ED Notes (Signed)
See triage note  Presents with cramping to both LE  States cramping in left leg goes from lower leg into thigh  Right leg cramping in thigh only    Ambulates well

## 2020-07-27 NOTE — ED Triage Notes (Signed)
Pt c/o cramping in the BL LE and joint pain, pt is ambulatory with a steady gait, no distress noted at this time

## 2020-07-27 NOTE — ED Provider Notes (Signed)
[facility] Emergency Department Provider Note   ____________________________________________   Event Date/Time   First MD Initiated Contact with Patient 07/27/20 [phone]     (approximate)  I have reviewed the triage vital signs and the nursing notes.   HISTORY  Chief Complaint No chief complaint on file.    HPI Samantha West is a 55 y.o. female patient complain of bilateral leg cramping/pain  increasing in the past 2 days.  Patient denies chest pain or dyspnea.  Patient denies increased physical activity except for prolonged standing which is required for her job.  Patient stated pain starts in the calf and legs and extends to the popliteal area.  Patient states there is a tightness with flexion of the knees  left greater than right.  Rates pain as a 4/10.  No palliative measure for complaint.         Past Medical History:  Diagnosis Date  . Abnormal Pap smear of cervix    -1980s hx of colposcopy and cryotherapy to cervix--paps normal since  . Anemia 2014   prior to Hysterectomy--because of fibroid  . Cancer ([place]) 02/2017   DCIS--Breast CA- Left  . Fibroid   . History of hiatal hernia    small  . Leukopenia   . Lipoma of back   . Migraine    h/o migraines prior to hysterectomy  . Other specified diseases of liver   . Personal history of radiation therapy 2019   Left breast  . Seborrheic keratoses     Patient Active Problem List   Diagnosis Date Noted  . 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 ABDOMINAL HYSTERECTOMY  2014  . APPENDECTOMY  1986  . 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 BREAST BIOPSY Left 03/29/2017   subareolar wing clip- fibroadenoma  . BREAST BIOPSY Left 04/11/2017   9:00 ribbon clip-fibroadenoma  . BREAST CYST EXCISION Left 1982   3 cyst removed  . BREAST CYST EXCISION Right 1982   4 cyst removed  . BREAST LUMPECTOMY  Left 05/16/2017   Wide excision 12 mm area high grade DCIS, 1 mm anterior margin(subcutaneous fat). ER 51-90%/ PR 1-10%.  Surgeon: Robert Bellow, MD;  Location: [facility] ORS;  Service: General;  Laterality: Left;  . CESAREAN SECTION     x 3  . COLONOSCOPY W/ BIOPSIES  12/29/2015   Tubular adenoma without atypica, Verdia Kuba, [place],.  . COLONOSCOPY WITH PROPOFOL N/A 08/08/2019   Procedure: COLONOSCOPY WITH PROPOFOL;  Surgeon: Robert Bellow, MD;  Location: [facility] ENDOSCOPY;  Service: Endoscopy;  Laterality: N/A;  . CYSTOSCOPY N/A 10/08/2012   Procedure: CYSTOSCOPY;  Surgeon: Azalia Bilis, MD;  Location: [place] [facility];  Service: Gynecology;  Laterality: N/A;  . ESOPHAGOGASTRODUODENOSCOPY    . ESOPHAGOGASTRODUODENOSCOPY (EGD) WITH PROPOFOL N/A 08/02/2018   Procedure: ESOPHAGOGASTRODUODENOSCOPY (EGD) WITH PROPOFOL;  Surgeon: Lollie Sails, MD;  Location: [facility] ENDOSCOPY;  Service: Endoscopy;  Laterality: N/A;  . ROBOTIC ASSISTED TOTAL HYSTERECTOMY N/A 10/08/2012   Procedure: ROBOTIC ASSISTED TOTAL HYSTERECTOMY WITH BILATERAL SALPINGECTOMY;  Surgeon: Azalia Bilis, MD;  Location: [place] [facility];  Service: Gynecology;  Laterality: N/A;  . TUBAL LIGATION      Prior to Admission medications   Medication Sig Start Date End Date Taking? Authorizing Provider  acetaminophen (TYLENOL) 500 MG tablet Take 500 mg by mouth every 6 (six) hours as needed (FOR PAIN.).  [provider]  aspirin EC 81 MG tablet Take 81 mg by mouth daily.    [provider]  Multiple Vitamin (MULTIVITAMIN WITH MINERALS) TABS tablet Take 1 tablet by mouth 3 (three) times a week. 2-3 TIMES A WEEK    [provider]  tamoxifen (NOLVADEX) 20 MG tablet TAKE 1 TABLET BY MOUTH EVERY DAY 08/14/18   Byrnett, Forest Gleason, MD    Allergies Doxepin  Family History  Problem Relation Age of Onset  . Hypertension Mother   . Heart failure Mother   . Stroke Mother        Dec d/t complications of stroke at age 79  .  Alzheimer's disease Father   . Diabetes Sister   . Hypertension Sister   . Diabetes Paternal Grandmother   . Breast cancer Neg Hx     Social History Social History   Tobacco Use  . Smoking status: Never Smoker  . Smokeless tobacco: Never Used  Vaping Use  . Vaping Use: Never used  Substance Use Topics  . Alcohol use: Not Currently  . Drug use: No    Review of Systems Constitutional: No fever/chills Eyes: No visual changes. ENT: No sore throat. Cardiovascular: Denies chest pain. Respiratory: Denies shortness of breath. Gastrointestinal: No abdominal pain.  No nausea, no vomiting.  No diarrhea.  No constipation. Genitourinary: Negative for dysuria. Musculoskeletal: Bilateral lower extremity pain. Skin: Negative for rash. Neurological: Negative for headaches, focal weakness or numbness. Allergic/Immunilogical: Doxepin  ____________________________________________   PHYSICAL EXAM:  VITAL SIGNS: ED Triage Vitals  Enc Vitals Group     BP 07/27/20 0942 132/81     Pulse Rate 07/27/20 0942 70     Resp 07/27/20 0942 18     Temp 07/27/20 0942 98.1 F (36.7 C)     Temp Source 07/27/20 0942 Oral     SpO2 07/27/20 0942 99 %     Weight 07/27/20 0944 130 lb (59 kg)     Height 07/27/20 0944 5\' 2"  (1.575 m)     Head Circumference --      Peak Flow --      Pain Score 07/27/20 0944 4     Pain Loc --      Pain Edu? --      Excl. in [place]? --     Constitutional: Alert and oriented. Well appearing and in no acute distress. Cardiovascular: Normal rate, regular rhythm. Grossly normal heart sounds.  Good peripheral circulation.   Respiratory: Normal respiratory effort.  No retractions. Lungs CTAB. Gastrointestinal: Soft and nontender. No distention. No abdominal bruits. No CVA tenderness. Musculoskeletal: No lower extremity tenderness nor edema.  No joint effusions. Neurologic:  Normal speech and language. No gross focal neurologic deficits are appreciated. No gait  instability. Skin:  Skin is warm, dry and intact. No rash noted. Psychiatric: Mood and affect are normal. Speech and behavior are normal.  ____________________________________________   LABS (all labs ordered are listed, but only abnormal results are displayed)  Labs Reviewed - No data to display ____________________________________________  EKG   ____________________________________________  RADIOLOGY I, Sable Feil, personally viewed and evaluated these images (plain radiographs) as part of my medical decision making, as well as reviewing the written report by the radiologist.  ED MD interpretation:    Official radiology report(s): No results found.  ____________________________________________   PROCEDURES  Procedure(s) performed (including Critical Care):  Procedures   ____________________________________________   INITIAL IMPRESSION / ASSESSMENT AND PLAN / ED COURSE  As part  of my medical decision making, I reviewed the following data within the [place]         Patient presents with bilateral calf pain left greater than right.  Differential is consistent muscle cramps, DVT, or compartment syndrome.  Discussed no acute findings of ultrasound of the left lower extremity.  Discussed no abnormal findings on labs.  Patient complaining physical exam consistent with muscle cramps.  Patient given discharge care instruction a prescription for Norflex and naproxen.  Patient advised follow-up PCP.      ____________________________________________   FINAL CLINICAL IMPRESSION(S) / ED DIAGNOSES  Final diagnoses:  None     ED Discharge Orders    None      *Please note:  Samantha West was evaluated in Emergency Department on 07/27/2020 for the symptoms described in the history of present illness. She was evaluated in the context of the global COVID-19 pandemic, which necessitated consideration that the patient might be at risk for  infection with the SARS-CoV-2 virus that causes COVID-19. Institutional protocols and algorithms that pertain to the evaluation of patients at risk for COVID-19 are in a state of rapid change based on information released by regulatory bodies including the CDC and federal and state organizations. These policies and algorithms were followed during the patient's care in the ED.  Some ED evaluations and interventions may be delayed as a result of limited staffing during and the pandemic.*   Note:  This document was prepared using Dragon voice recognition software and may include unintentional dictation errors.    Sable Feil, PA-C 07/27/20 1108    Carrie Mew, MD 07/27/20 1515

## 2020-08-26 ENCOUNTER — Ambulatory Visit
Admission: RE | Admit: 2020-08-26 | Discharge: 2020-08-26 | Disposition: A | Discharge status: Home / Self Care | Payer: BC Managed Care – PPO | Source: Ambulatory Visit | Attending: Radiation Oncology | Admitting: Radiation Oncology

## 2020-08-26 VITALS — HR 69 | Temp 97.6°F | Resp 16 | Wt 131.6 lb

## 2020-08-26 DIAGNOSIS — D0512 Intraductal carcinoma in situ of left breast: Secondary | ICD-10-CM

## 2020-08-26 DIAGNOSIS — Z79810 Long term (current) use of selective estrogen receptor modulators (SERMs): Secondary | ICD-10-CM | POA: Insufficient documentation

## 2020-08-26 DIAGNOSIS — Z923 Personal history of irradiation: Secondary | ICD-10-CM | POA: Diagnosis not present

## 2020-08-26 DIAGNOSIS — Z170 Estrogen receptor positive status [ER+]: Secondary | ICD-10-CM | POA: Insufficient documentation

## 2020-08-26 NOTE — Progress Notes (Signed)
Radiation Oncology Follow up Note  Name: Samantha West   Date:   08/26/2020 MRN:  [id number] DOB: 1965/05/31    This 55 y.o. female presents to the clinic today for 3-year follow-up status post whole breast radiation to her left breast for ER/PR positive ductal carcinoma in situ.  REFERRING PROVIDER: Sharyne Peach, MD  HPI: Patient is a 55 year old female now out 3 years having pleated whole breast radiation to her left breast for ER/PR positive ductal carcinoma in situ.  Seen today in routine follow-up she is doing well.  Unfortunately she recently lost her father.  She specifically denies breast tenderness cough or bone pain..  She had mammograms back in March which I have reviewed were BI-RADS 1 negative.  She is currently on tamoxifen tolerating that well without side effect.  COMPLICATIONS OF TREATMENT: none  FOLLOW UP COMPLIANCE: keeps appointments   PHYSICAL EXAM:  Pulse 69   Temp 97.6 F (36.4 C) (Tympanic)   Resp 16   Wt 131 lb 9.6 oz (59.7 kg)   LMP 08/25/2012   BMI 24.07 kg/m  Lungs are clear to A&P cardiac examination essentially unremarkable with regular rate and rhythm. No dominant mass or nodularity is noted in either breast in 2 positions examined. Incision is well-healed. No axillary or supraclavicular adenopathy is appreciated. Cosmetic result is excellent.  Well-developed well-nourished patient in NAD. HEENT reveals PERLA, EOMI, discs not visualized.  Oral cavity is clear. No oral mucosal lesions are identified. Neck is clear without evidence of cervical or supraclavicular adenopathy. Lungs are clear to A&P. Cardiac examination is essentially unremarkable with regular rate and rhythm without murmur rub or thrill. Abdomen is benign with no organomegaly or masses noted. Motor sensory and DTR levels are equal and symmetric in the upper and lower extremities. Cranial nerves II through XII are grossly intact. Proprioception is intact. No peripheral adenopathy or edema is  identified. No motor or sensory levels are noted. Crude visual fields are within normal range.  RADIOLOGY RESULTS: Mammograms reviewed compatible with above-stated findings  PLAN: Present time patient continues to do well 3 years out with no evidence of disease.  I am pleased with his overall progress.  I have asked to see her back in 1 year for follow-up.  Patient is to call with any concerns.  She continues on tamoxifen without side effect.  I would like to take this opportunity to thank you for allowing me to participate in the care of your patient.Samantha Filbert, MD

## 2020-11-22 ENCOUNTER — Ambulatory Visit (INDEPENDENT_AMBULATORY_CARE_PROVIDER_SITE_OTHER): Payer: BC Managed Care – PPO | Admitting: Obstetrics and Gynecology

## 2020-11-22 ENCOUNTER — Encounter: Payer: Self-pay | Admitting: Obstetrics and Gynecology

## 2020-11-22 ENCOUNTER — Other Ambulatory Visit: Payer: Self-pay

## 2020-11-22 ENCOUNTER — Telehealth: Payer: Self-pay | Admitting: Obstetrics and Gynecology

## 2020-11-22 VITALS — BP 110/66 | HR 74 | Ht 61.0 in | Wt 133.0 lb

## 2020-11-22 DIAGNOSIS — M7989 Other specified soft tissue disorders: Secondary | ICD-10-CM | POA: Diagnosis not present

## 2020-11-22 DIAGNOSIS — Z780 Asymptomatic menopausal state: Secondary | ICD-10-CM

## 2020-11-22 DIAGNOSIS — N63 Unspecified lump in unspecified breast: Secondary | ICD-10-CM

## 2020-11-22 DIAGNOSIS — Z01419 Encounter for gynecological examination (general) (routine) without abnormal findings: Secondary | ICD-10-CM

## 2020-11-22 DIAGNOSIS — Z853 Personal history of malignant neoplasm of breast: Secondary | ICD-10-CM

## 2020-11-22 NOTE — Telephone Encounter (Signed)
Please schedule dx left mammogram and left breast/axillary [place] at [facility] for patient.  She is reporting left breast/axillary swelling.  She has a hx of left breast cancer.

## 2020-11-22 NOTE — Patient Instructions (Signed)

## 2020-11-22 NOTE — Progress Notes (Signed)
55 y.o. [id number] Divorced [place] American female here for annual exam.    Had last covid booster 03/2019. She states has had swelling under left arm where she had breast cancer. This is occurring for a couple of months. She did see her surgeon and oncologist but they thought okay. She had office evaluation with [place] with her breast surgeon on 05/27/20.   Have recurrent rectal bleeding with straining x 3 years.  Has seen her GI.  She was told to return if this recurs.   Lost her father in June at age 48.   PCP:   Salome Holmes, MD  Patient's last menstrual period was 08/25/2012.           Sexually active: Yes.    The current method of family planning is status post hysterectomy.    Exercising: Yes.     Walks 10,000 steps 4-5 days/wk Smoker:  no  Health Maintenance: Pap: 11-12-14 Neg:Neg HR HPV History of abnormal Pap:  yes, Hx cryotherapy to cervix 1980's--paps normal since MMG: 05-20-20 3D/Neg/BiRads1 Colonoscopy: 08-08-19 normal;next 5 yrs d/t hx of polyps BMD:   NA TDaP: 11-12-14 Gardasil:   no HIV: no Hep C: 02-12-19 Neg Screening Labs:  PCP   reports that she has never smoked. She has never used smokeless tobacco. She reports that she does not currently use alcohol. She reports that she does not use drugs.  Past Medical History:  Diagnosis Date   Abnormal Pap smear of cervix    -1980s hx of colposcopy and cryotherapy to cervix--paps normal since   Anemia 2014   prior to Hysterectomy--because of fibroid   Cancer ([place]) 02/2017   DCIS--Breast CA- Left   COVID 03/2019   Fibroid    History of hiatal hernia    small   Leukopenia    Lipoma of back    Migraine    h/o migraines prior to hysterectomy   Other specified diseases of liver    Personal history of radiation therapy 2019   Left breast   Seborrheic keratoses     Past Surgical History:  Procedure Laterality Date   ABDOMINAL HYSTERECTOMY  2014   APPENDECTOMY  1986   BREAST BIOPSY Left 03/29/2017   4:00 coil clip  DCIS   BREAST BIOPSY Left 03/29/2017   subareolar wing clip- fibroadenoma   BREAST BIOPSY Left 04/11/2017   9:00 ribbon clip-fibroadenoma   BREAST CYST EXCISION Left 1982   3 cyst removed   BREAST CYST EXCISION Right 1982   4 cyst removed   BREAST LUMPECTOMY Left 05/16/2017   Wide excision 12 mm area high grade DCIS, 1 mm anterior margin(subcutaneous fat). ER 51-90%/ PR 1-10%.  Surgeon: Robert Bellow, MD;  Location: [facility] ORS;  Service: General;  Laterality: Left;   CESAREAN SECTION     x 3   COLONOSCOPY W/ BIOPSIES  12/29/2015   Tubular adenoma without atypica, Verdia Kuba, [place],.   COLONOSCOPY WITH PROPOFOL N/A 08/08/2019   Procedure: COLONOSCOPY WITH PROPOFOL;  Surgeon: Robert Bellow, MD;  Location: [facility] ENDOSCOPY;  Service: Endoscopy;  Laterality: N/A;   CYSTOSCOPY N/A 10/08/2012   Procedure: CYSTOSCOPY;  Surgeon: Azalia Bilis, MD;  Location: [place] [facility];  Service: Gynecology;  Laterality: N/A;   ESOPHAGOGASTRODUODENOSCOPY     ESOPHAGOGASTRODUODENOSCOPY (EGD) WITH PROPOFOL N/A 08/02/2018   Procedure: ESOPHAGOGASTRODUODENOSCOPY (EGD) WITH PROPOFOL;  Surgeon: Lollie Sails, MD;  Location: [facility] ENDOSCOPY;  Service: Endoscopy;  Laterality: N/A;   ROBOTIC ASSISTED TOTAL HYSTERECTOMY N/A 10/08/2012  Procedure: ROBOTIC ASSISTED TOTAL HYSTERECTOMY WITH BILATERAL SALPINGECTOMY;  Surgeon: Azalia Bilis, MD;  Location: [place] [facility];  Service: Gynecology;  Laterality: N/A;   TUBAL LIGATION      Current Outpatient Medications  Medication Sig Dispense Refill   acetaminophen (TYLENOL) 500 MG tablet Take 500 mg by mouth every 6 (six) hours as needed (FOR PAIN.).     aspirin EC 81 MG tablet Take 81 mg by mouth daily.     Multiple Vitamin (MULTIVITAMIN WITH MINERALS) TABS tablet Take 1 tablet by mouth 3 (three) times a week. 2-3 TIMES A WEEK     tamoxifen (NOLVADEX) 20 MG tablet TAKE 1 TABLET BY MOUTH EVERY DAY 30 tablet 12   zolpidem (AMBIEN) 5 MG tablet Take 1 tablet by mouth at bedtime.      No current facility-administered medications for this visit.    Family History  Problem Relation Age of Onset   Hypertension Mother    Heart failure Mother    Stroke Mother        Dec d/t complications of stroke at age 50   Alzheimer's disease Father    Diabetes Sister    Hypertension Sister    Diabetes Paternal Grandmother    Breast cancer Neg Hx     Review of Systems  All other systems reviewed and are negative.  Exam:   BP 110/66   Pulse 74   Ht '5\' 1"'$  (1.549 m)   Wt 133 lb (60.3 kg)   LMP 08/25/2012   SpO2 100%   BMI 25.13 kg/m     General appearance: alert, cooperative and appears stated age Head: normocephalic, without obvious abnormality, atraumatic Neck: no adenopathy, supple, symmetrical, trachea midline and thyroid normal to inspection and palpation Lungs: clear to auscultation bilaterally Breasts: normal appearance, no masses or tenderness, No nipple retraction or dimpling, No nipple discharge or bleeding, No axillary adenopathy Heart: regular rate and rhythm Abdomen: soft, non-tender; no masses, no organomegaly Extremities: extremities normal, atraumatic, no cyanosis or edema Skin: skin color, texture, turgor normal. No rashes or lesions Lymph nodes: cervical, supraclavicular, and axillary nodes normal. Neurologic: grossly normal  Pelvic: External genitalia:  no lesions              No abnormal inguinal nodes palpated.              Urethra:  normal appearing urethra with no masses, tenderness or lesions              Bartholins and Skenes: normal                 Vagina: normal appearing vagina with normal color and discharge, no lesions              Cervix: absent              Pap taken: no Bimanual Exam:  Uterus: absent              Adnexa: no mass, fullness, tenderness              Rectal exam: yes.  Confirms.              Anus:  normal sphincter tone, no lesions  Chaperone was present for exam:  Estill Bamberg, CMA  Assessment:   Well woman visit with  gynecologic exam. Status post hysterectomy.  Ovaries remain.  Hx left breast DCIS.  On Tamoxifen, since March 2019.  Left axillary swelling.  Menopause. Rectal bleeding.   Plan: Mammogram screening  discussed. Self breast awareness reviewed. Pap and HR HPV as above. Guidelines for Calcium, Vitamin D, regular exercise program including cardiovascular and weight bearing exercise. Make an appointment for a dx left mammogram and left breast/axillary [place].  She will do a BMD.  She will follow up with GI.  Follow up annually and prn.   After visit summary provided.

## 2020-11-23 NOTE — Telephone Encounter (Signed)
Orders placed at [facility] breast center at [facility]. I left detailed message on patient voicemail she may call and schedule [phone]

## 2020-11-29 NOTE — Telephone Encounter (Signed)
Left message for patient to call again, I did not see she is scheduled.

## 2020-12-01 NOTE — Telephone Encounter (Signed)
Patient informed with below,she has the number to call and schedule. She asked if dexa order can be placed at same location, I informed patient it appears Dr.Silva placed the order already. Patient will call to schedule.

## 2020-12-21 NOTE — Telephone Encounter (Signed)
Called patient again and left message on voicemail to call [place] [facility] as they will not leave me schedule this for her and they do not call the patient to schedule. I asked patient to call me back with update.

## 2021-01-03 ENCOUNTER — Encounter: Payer: Self-pay | Admitting: *Deleted

## 2021-01-03 NOTE — Telephone Encounter (Signed)
Letter printed and mailed to patient.

## 2021-01-11 NOTE — Telephone Encounter (Signed)
Chart reviewed.  Patient has been advised in office, by phone and by letter. You may close this encounter.

## 2021-01-11 NOTE — Telephone Encounter (Signed)
Dr.Silva I just please see the below regarding patient breast imaging. I have spoke and left message with her several times regarding imaging and not call back. Just wanted to make you aware of this.

## 2021-02-24 ENCOUNTER — Ambulatory Visit
Admission: RE | Admit: 2021-02-24 | Discharge: 2021-02-24 | Disposition: A | Discharge status: Home / Self Care | Payer: BC Managed Care – PPO | Source: Ambulatory Visit | Attending: Obstetrics and Gynecology | Admitting: Obstetrics and Gynecology

## 2021-02-24 ENCOUNTER — Other Ambulatory Visit: Payer: Self-pay

## 2021-02-24 DIAGNOSIS — M7989 Other specified soft tissue disorders: Secondary | ICD-10-CM

## 2021-02-24 DIAGNOSIS — N63 Unspecified lump in unspecified breast: Secondary | ICD-10-CM

## 2021-02-24 DIAGNOSIS — Z853 Personal history of malignant neoplasm of breast: Secondary | ICD-10-CM

## 2021-04-18 ENCOUNTER — Other Ambulatory Visit: Payer: Self-pay | Admitting: General Surgery

## 2021-04-18 DIAGNOSIS — D0512 Intraductal carcinoma in situ of left breast: Secondary | ICD-10-CM

## 2021-05-31 ENCOUNTER — Other Ambulatory Visit: Payer: Self-pay

## 2021-05-31 ENCOUNTER — Ambulatory Visit
Admission: RE | Admit: 2021-05-31 | Discharge: 2021-05-31 | Disposition: A | Discharge status: Home / Self Care | Payer: BC Managed Care – PPO | Source: Ambulatory Visit | Attending: General Surgery | Admitting: General Surgery

## 2021-05-31 DIAGNOSIS — Z1231 Encounter for screening mammogram for malignant neoplasm of breast: Secondary | ICD-10-CM | POA: Diagnosis not present

## 2021-05-31 DIAGNOSIS — D0512 Intraductal carcinoma in situ of left breast: Secondary | ICD-10-CM | POA: Diagnosis present

## 2021-08-26 ENCOUNTER — Ambulatory Visit: Payer: BC Managed Care – PPO | Admitting: Radiation Oncology

## 2021-09-08 ENCOUNTER — Ambulatory Visit
Admission: RE | Admit: 2021-09-08 | Discharge: 2021-09-08 | Disposition: A | Discharge status: Home / Self Care | Payer: BC Managed Care – PPO | Source: Ambulatory Visit | Attending: Radiation Oncology | Admitting: Radiation Oncology

## 2021-09-08 VITALS — BP 128/91 | HR 60 | Resp 16 | Ht 61.0 in

## 2021-09-08 DIAGNOSIS — Z170 Estrogen receptor positive status [ER+]: Secondary | ICD-10-CM | POA: Insufficient documentation

## 2021-09-08 DIAGNOSIS — Z79810 Long term (current) use of selective estrogen receptor modulators (SERMs): Secondary | ICD-10-CM | POA: Diagnosis not present

## 2021-09-08 DIAGNOSIS — D0512 Intraductal carcinoma in situ of left breast: Secondary | ICD-10-CM | POA: Insufficient documentation

## 2021-09-08 DIAGNOSIS — Z923 Personal history of irradiation: Secondary | ICD-10-CM | POA: Diagnosis not present

## 2021-09-08 NOTE — Progress Notes (Signed)
Radiation Oncology Follow up Note  Name: Samantha West   Date:   09/08/2021 MRN:  [id number] DOB: 1965-09-10    This 56 y.o. female presents to the clinic today for 4-year follow-up status post whole breast radiation to her left breast for ER/PR positive ductal carcinoma in situ.  REFERRING PROVIDER: Sharyne Peach, MD  HPI: Patient is a 56 year old female now at over 4 years having pleated whole breast radiation to her left breast for ER/PR positive ductal carcinoma in situ.. Seen today in routine follow-up she is doing well.  She specifically denies breast tenderness cough or bone pain.  She had mammograms back in March which I have reviewed were BI-RADS 1 negative.  She is currently on tamoxifen tolerating that well without side effect. COMPLICATIONS OF TREATMENT: none  FOLLOW UP COMPLIANCE: keeps appointments   PHYSICAL EXAM:  BP (!) 128/91   Pulse 60   Resp 16   Ht '5\' 1"'$  (1.549 m)   LMP 08/25/2012   BMI 25.13 kg/m  Lungs are clear to A&P cardiac examination essentially unremarkable with regular rate and rhythm. No dominant mass or nodularity is noted in either breast in 2 positions examined. Incision is well-healed. No axillary or supraclavicular adenopathy is appreciated. Cosmetic result is excellent.  Well-developed well-nourished patient in NAD. HEENT reveals PERLA, EOMI, discs not visualized.  Oral cavity is clear. No oral mucosal lesions are identified. Neck is clear without evidence of cervical or supraclavicular adenopathy. Lungs are clear to A&P. Cardiac examination is essentially unremarkable with regular rate and rhythm without murmur rub or thrill. Abdomen is benign with no organomegaly or masses noted. Motor sensory and DTR levels are equal and symmetric in the upper and lower extremities. Cranial nerves II through XII are grossly intact. Proprioception is intact. No peripheral adenopathy or edema is identified. No motor or sensory levels are noted. Crude visual fields are  within normal range.  RADIOLOGY RESULTS: Mammograms reviewed compatible with above-stated findings  PLAN: Present time patient is doing well with no evidence of disease now out over 4 years.  I am going to discontinue follow-up care.  I be happy to reevaluate her anytime should that be indicated.  Patient is to call with any concerns.  I would like to take this opportunity to thank you for allowing me to participate in the care of your patient.Noreene Filbert, MD

## 2021-09-12 ENCOUNTER — Ambulatory Visit
Admission: RE | Admit: 2021-09-12 | Discharge: 2021-09-12 | Disposition: A | Discharge status: Home / Self Care | Payer: No Typology Code available for payment source | Source: Ambulatory Visit | Attending: Family Medicine | Admitting: Family Medicine

## 2021-09-12 ENCOUNTER — Other Ambulatory Visit: Payer: Self-pay | Admitting: Family Medicine

## 2021-09-12 ENCOUNTER — Ambulatory Visit
Admission: RE | Admit: 2021-09-12 | Discharge: 2021-09-12 | Disposition: A | Discharge status: Home / Self Care | Payer: No Typology Code available for payment source | Attending: Family Medicine | Admitting: Family Medicine

## 2021-09-12 DIAGNOSIS — M79672 Pain in left foot: Secondary | ICD-10-CM

## 2022-01-31 NOTE — Progress Notes (Signed)
56 y.o. [id number] Divorced [place] American female here for annual exam.    She has tenderness and fullness under both arms, and she has concern about this.  She has a history of DCIS of the left breast.  Her oncologist is at [facility], and she is currently on Tamoxifen.   She had a right axillary US done through [facility], March 2022, which was reassuring.  She had a left axillary US done through the [facility] on 02/24/21, which was reassuring.  Notes vaginal dryness and discomfort with intercourse.  Grandson currently has RSV.   Works for [employer] for many years.  PCP:   Dr. Iona Beard - [facility] in [place].   Patient's last menstrual period was 08/25/2012.           Sexually active: Yes.    The current method of family planning is status post hysterectomy.    Exercising: No.   Smoker:  no  Health Maintenance: Pap:   11-12-14 Neg:Neg HR HPV  History of abnormal Pap:  yes, Hx cryotherapy to cervix 1980's--paps normal since  MMG:  05/31/21, Breast Density Category C, BI-RADS CATEGORY 1 Negative Colonoscopy:   08-08-19 normal;next 5 yrs d/t hx of polyps  BMD:   n/a  Result  n/a TDaP:  11/12/14 Gardasil:   no HIV: no Hep C: 02/12/19 neg Screening Labs:  PCP next week.  Flu vaccine:  Declined.   reports that she has never smoked. She has never used smokeless tobacco. She reports that she does not currently use alcohol. She reports that she does not use drugs.  Past Medical History:  Diagnosis Date   Abnormal Pap smear of cervix    -1980s hx of colposcopy and cryotherapy to cervix--paps normal since   Anemia 2014   prior to Hysterectomy--because of fibroid   Cancer ([place]) 02/2017   DCIS--Breast CA- Left   COVID 03/2019   Fibroid    History of hiatal hernia    small   Leukopenia    Lipoma of back    Migraine    h/o migraines prior to hysterectomy   Other specified diseases of liver    Personal history of radiation therapy 2019   Left breast   Seborrheic keratoses      Past Surgical History:  Procedure Laterality Date   ABDOMINAL HYSTERECTOMY  2014   APPENDECTOMY  1986   BREAST BIOPSY Left 03/29/2017   4:00 coil clip DCIS   BREAST BIOPSY Left 03/29/2017   subareolar wing clip- fibroadenoma   BREAST BIOPSY Left 04/11/2017   9:00 ribbon clip-fibroadenoma   BREAST CYST EXCISION Left 1982   3 cyst removed   BREAST CYST EXCISION Right 1982   4 cyst removed   BREAST LUMPECTOMY Left 05/16/2017   Wide excision 12 mm area high grade DCIS, 1 mm anterior margin(subcutaneous fat). ER 51-90%/ PR 1-10%.  Surgeon: Robert Bellow, MD;  Location: [facility];  Service: General;  Laterality: Left;   CESAREAN SECTION     x 3   COLONOSCOPY W/ BIOPSIES  12/29/2015   Tubular adenoma without atypica, Verdia Kuba, [place],.   COLONOSCOPY WITH PROPOFOL N/A 08/08/2019   Procedure: COLONOSCOPY WITH PROPOFOL;  Surgeon: Robert Bellow, MD;  Location: [facility];  Service: Endoscopy;  Laterality: N/A;   CYSTOSCOPY N/A 10/08/2012   Procedure: CYSTOSCOPY;  Surgeon: Azalia Bilis, MD;  Location: [facility];  Service: Gynecology;  Laterality: N/A;   ESOPHAGOGASTRODUODENOSCOPY     ESOPHAGOGASTRODUODENOSCOPY (EGD) WITH PROPOFOL N/A 08/02/2018  Procedure: ESOPHAGOGASTRODUODENOSCOPY (EGD) WITH PROPOFOL;  Surgeon: Lollie Sails, MD;  Location: [facility] ENDOSCOPY;  Service: Endoscopy;  Laterality: N/A;   ROBOTIC ASSISTED TOTAL HYSTERECTOMY N/A 10/08/2012   Procedure: ROBOTIC ASSISTED TOTAL HYSTERECTOMY WITH BILATERAL SALPINGECTOMY;  Surgeon: Azalia Bilis, MD;  Location: [facility];  Service: Gynecology;  Laterality: N/A;   TUBAL LIGATION      Current Outpatient Medications  Medication Sig Dispense Refill   acetaminophen (TYLENOL) 500 MG tablet Take 500 mg by mouth every 6 (six) hours as needed (FOR PAIN.).     aspirin EC 81 MG tablet Take 81 mg by mouth daily.     Multiple Vitamin (MULTIVITAMIN WITH MINERALS) TABS tablet Take 1 tablet by mouth 3 (three) times a week. 2-3 TIMES A  WEEK     tamoxifen (NOLVADEX) 20 MG tablet TAKE 1 TABLET BY MOUTH EVERY DAY 30 tablet 12   zolpidem (AMBIEN) 5 MG tablet Take 1 tablet by mouth at bedtime.     No current facility-administered medications for this visit.    Family History  Problem Relation Age of Onset   Hypertension Mother    Heart failure Mother    Stroke Mother        Dec d/t complications of stroke at age 28   Alzheimer's disease Father    Diabetes Sister    Hypertension Sister    Diabetes Paternal Grandmother    Breast cancer Neg Hx     Review of Systems  All other systems reviewed and are negative.   Exam:   BP 110/68 (BP Location: Right Arm, Patient Position: Sitting, Cuff Size: Normal)   Pulse 72   Ht '5\' 1"'$  (1.549 m)   Wt 134 lb (60.8 kg)   LMP 08/25/2012   BMI 25.32 kg/m     General appearance: alert, cooperative and appears stated age Head: normocephalic, without obvious abnormality, atraumatic Neck: no adenopathy, supple, symmetrical, trachea midline and thyroid normal to inspection and palpation Lungs: clear to auscultation bilaterally Breasts: normal appearance, no masses or tenderness, No nipple retraction or dimpling, No nipple discharge or bleeding, Right axillary fullness, 3 cm, nontender. Left axilla with no mass.  Heart: regular rate and rhythm Abdomen: soft, non-tender; no masses, no organomegaly Extremities: extremities normal, atraumatic, no cyanosis or edema Skin: skin color, texture, turgor normal. No rashes or lesions Lymph nodes: cervical, supraclavicular, and axillary nodes normal. Neurologic: grossly normal  Pelvic: External genitalia:  no lesions              No abnormal inguinal nodes palpated.              Urethra:  normal appearing urethra with no masses, tenderness or lesions              Bartholins and Skenes: normal                 Vagina: normal appearing vagina with normal color and discharge, no lesions              Cervix: absent              Pap taken:  no Bimanual Exam:  Uterus:  absent              Adnexa: no mass, fullness, tenderness              Rectal exam: yes.  Confirms.              Anus:  normal sphincter tone, no lesions  Chaperone was present for exam:  Kimalexis  Assessment:   Well woman visit with gynecologic exam. Status post hysterectomy.  Ovaries remain.  Hx left breast DCIS.  On Tamoxifen, since March 2019.  Bilateral axillary swelling.  I feel a 3 cm mass on the right.   Plan: Will proceed with dx bilateral mammogram and bilateral axillary ultrasounds.. Self breast awareness reviewed. Pap and HR HPV not indicated.  Guidelines for Calcium, Vitamin D, regular exercise program including cardiovascular and weight bearing exercise. Follow up annually and prn.   After visit summary provided.

## 2022-02-07 ENCOUNTER — Encounter: Payer: Self-pay | Admitting: Obstetrics and Gynecology

## 2022-02-07 ENCOUNTER — Ambulatory Visit (INDEPENDENT_AMBULATORY_CARE_PROVIDER_SITE_OTHER): Payer: BC Managed Care – PPO | Admitting: Obstetrics and Gynecology

## 2022-02-07 ENCOUNTER — Telehealth: Payer: Self-pay | Admitting: Obstetrics and Gynecology

## 2022-02-07 VITALS — BP 110/68 | HR 72 | Ht 61.0 in | Wt 134.0 lb

## 2022-02-07 DIAGNOSIS — Z853 Personal history of malignant neoplasm of breast: Secondary | ICD-10-CM

## 2022-02-07 DIAGNOSIS — R2232 Localized swelling, mass and lump, left upper limb: Secondary | ICD-10-CM

## 2022-02-07 DIAGNOSIS — Z01419 Encounter for gynecological examination (general) (routine) without abnormal findings: Secondary | ICD-10-CM | POA: Diagnosis not present

## 2022-02-07 DIAGNOSIS — R2231 Localized swelling, mass and lump, right upper limb: Secondary | ICD-10-CM | POA: Diagnosis not present

## 2022-02-07 DIAGNOSIS — R2233 Localized swelling, mass and lump, upper limb, bilateral: Secondary | ICD-10-CM

## 2022-02-07 NOTE — Patient Instructions (Signed)

## 2022-02-07 NOTE — Telephone Encounter (Signed)
Please schedule bilateral dx mammogram and bilateral axillary ultrasounds at the [facility].   My patient has a history of DCIS of the left breast and she has bilateral axillary fullness.  I feel a 3 cm mass of the right axilla.

## 2022-02-08 NOTE — Telephone Encounter (Signed)
Orders placed at breast center  

## 2022-02-10 NOTE — Telephone Encounter (Signed)
Breast center left message on 02/09/22 to schedule.

## 2022-02-15 NOTE — Telephone Encounter (Signed)
Patient last imaging was done at [facility] breast center. Orders placed at this location.

## 2022-02-16 NOTE — Telephone Encounter (Signed)
[facility] called and left message x1.

## 2022-02-24 NOTE — Telephone Encounter (Signed)
Please update status of patient's diagnostic breast imaging.

## 2022-03-08 NOTE — Telephone Encounter (Signed)
The imaging center contacted pt today and LVM for her to CB and schedule appt.

## 2022-03-22 NOTE — Telephone Encounter (Signed)
Imaging ctr contacted pt again on 03/14/2022 w/ no success. Appt has not yet been made. Please advise on how you would like to proceed? Letter? Thanks.

## 2022-03-22 NOTE — Telephone Encounter (Signed)
Please send patient letter recommending diagnostic bilateral mammogram and ultrasound due to bilateral axillary fullness.  She has a history of DCIS of the left breast and expressed concern over the bilateral axillary fullness and tenderness at her annual exam.  She takes Tamoxifen.   I did feel a 3 cm right axillary mass.   Please ask her to update contact information also.

## 2022-03-23 NOTE — Telephone Encounter (Signed)
Letter reviewed and signed by Dr. Quincy Simmonds.  Letter mailed to address on file.   Encounter closed.

## 2022-03-23 NOTE — Telephone Encounter (Signed)
Letter pended.  Copy of letter to Dr. Quincy Simmonds to review and sign.

## 2022-04-11 ENCOUNTER — Other Ambulatory Visit: Payer: Self-pay | Admitting: Obstetrics and Gynecology

## 2022-04-11 ENCOUNTER — Ambulatory Visit
Admission: RE | Admit: 2022-04-11 | Discharge: 2022-04-11 | Disposition: A | Discharge status: Home / Self Care | Payer: BC Managed Care – PPO | Source: Ambulatory Visit | Attending: Obstetrics and Gynecology | Admitting: Obstetrics and Gynecology

## 2022-04-11 DIAGNOSIS — R2231 Localized swelling, mass and lump, right upper limb: Secondary | ICD-10-CM

## 2022-04-11 DIAGNOSIS — Z853 Personal history of malignant neoplasm of breast: Secondary | ICD-10-CM

## 2022-04-11 DIAGNOSIS — R2232 Localized swelling, mass and lump, left upper limb: Secondary | ICD-10-CM

## 2022-04-20 ENCOUNTER — Other Ambulatory Visit: Payer: Self-pay | Admitting: Gastroenterology

## 2022-04-20 DIAGNOSIS — R1031 Right lower quadrant pain: Secondary | ICD-10-CM

## 2022-04-20 DIAGNOSIS — R140 Abdominal distension (gaseous): Secondary | ICD-10-CM

## 2022-04-21 ENCOUNTER — Ambulatory Visit: Payer: BC Managed Care – PPO

## 2022-04-25 ENCOUNTER — Ambulatory Visit
Admission: RE | Admit: 2022-04-25 | Discharge: 2022-04-25 | Disposition: A | Discharge status: Home / Self Care | Payer: BC Managed Care – PPO | Source: Ambulatory Visit | Attending: Gastroenterology | Admitting: Gastroenterology

## 2022-04-25 DIAGNOSIS — R140 Abdominal distension (gaseous): Secondary | ICD-10-CM

## 2022-04-25 DIAGNOSIS — R1031 Right lower quadrant pain: Secondary | ICD-10-CM | POA: Insufficient documentation

## 2022-04-25 DIAGNOSIS — R1032 Left lower quadrant pain: Secondary | ICD-10-CM | POA: Insufficient documentation

## 2022-09-27 ENCOUNTER — Encounter: Payer: Self-pay | Admitting: Obstetrics and Gynecology

## 2022-09-27 ENCOUNTER — Ambulatory Visit (INDEPENDENT_AMBULATORY_CARE_PROVIDER_SITE_OTHER): Payer: Self-pay | Admitting: Obstetrics and Gynecology

## 2022-09-27 VITALS — BP 116/76 | HR 79 | Ht 61.0 in | Wt 136.0 lb

## 2022-09-27 DIAGNOSIS — N898 Other specified noninflammatory disorders of vagina: Secondary | ICD-10-CM

## 2022-09-27 DIAGNOSIS — R300 Dysuria: Secondary | ICD-10-CM

## 2022-09-27 LAB — URINALYSIS, COMPLETE W/RFL CULTURE
Hyaline Cast: NONE SEEN /LPF
Nitrites, Initial: POSITIVE — AB
Specific Gravity, Urine: 1.02 (ref 1.001–1.035)
pH: 5 (ref 5.0–8.0)

## 2022-09-27 LAB — CULTURE INDICATED

## 2022-09-27 MED ORDER — METRONIDAZOLE 500 MG PO TABS: 500.0000 mg | ORAL_TABLET | Freq: Two times a day (BID) | ORAL | 0 refills | Status: AC

## 2022-09-27 MED ORDER — SULFAMETHOXAZOLE-TRIMETHOPRIM 800-160 MG PO TABS: 1.0000 | ORAL_TABLET | Freq: Two times a day (BID) | ORAL | 0 refills | Status: AC

## 2022-09-27 NOTE — Progress Notes (Signed)
GYNECOLOGY  VISIT   HPI: 57 y.o.   Married  [place] American  female   [phone] with Patient's last menstrual period was 08/25/2012.   here for potential UTI.  Has discomfort at the end of urination and irritation.  Has discharge-odor.  Notes pelvic pressure for 4 days.  Took AZO for 2 days, and no improvement.  No blood in the urine.  No fever, shakes or chills.  Some back pain.  GYNECOLOGIC HISTORY: Patient's last menstrual period was 08/25/2012. 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    Past Medical History:  Diagnosis Date   Abnormal Pap smear of cervix    -1980s hx of colposcopy and cryotherapy to cervix--paps normal since   Anemia 2014   prior to Hysterectomy--because of fibroid   Cancer ([facility]) 02/2017   DCIS--Breast CA- Left   COVID 03/2019   Fibroid    History of hiatal hernia    small   Leukopenia    Lipoma of back    Migraine    h/o migraines prior to hysterectomy   Other specified diseases of liver    Personal history of radiation therapy 2019   Left breast   Seborrheic keratoses     Past Surgical History:  Procedure Laterality Date   ABDOMINAL HYSTERECTOMY  2014   APPENDECTOMY  1986   BREAST BIOPSY Left 03/29/2017   4:00 coil clip DCIS   BREAST BIOPSY Left 03/29/2017   subareolar wing clip- fibroadenoma   BREAST BIOPSY Left 04/11/2017   9:00 ribbon clip-fibroadenoma   BREAST CYST EXCISION Left 1982   3 cyst removed   BREAST CYST EXCISION Right 1982   4 cyst removed    BREAST LUMPECTOMY Left 05/16/2017   Wide excision 12 mm area high grade DCIS, 1 mm anterior margin(subcutaneous fat). ER 51-90%/ PR 1-10%.  Surgeon: Earline Mayotte, MD;  Location: [facility];  Service: General;  Laterality: Left;   CESAREAN SECTION     x 3   COLONOSCOPY W/ BIOPSIES  12/29/2015   Tubular adenoma without atypica, Arty Baumgartner, [place],.   COLONOSCOPY WITH PROPOFOL N/A 08/08/2019   Procedure: COLONOSCOPY WITH PROPOFOL;  Surgeon: Earline Mayotte, MD;  Location: [facility];  Service: Endoscopy;  Laterality: N/A;   CYSTOSCOPY N/A 10/08/2012   Procedure: CYSTOSCOPY;  Surgeon: Bennye Alm, MD;  Location: [facility];  Service: Gynecology;  Laterality: N/A;   ESOPHAGOGASTRODUODENOSCOPY     ESOPHAGOGASTRODUODENOSCOPY (EGD) WITH PROPOFOL N/A 08/02/2018   Procedure: ESOPHAGOGASTRODUODENOSCOPY (EGD) WITH PROPOFOL;  Surgeon: Christena Deem, MD;  Location: [facility] ENDOSCOPY;  Service: Endoscopy;  Laterality: N/A;   ROBOTIC ASSISTED TOTAL HYSTERECTOMY N/A 10/08/2012  Procedure: ROBOTIC ASSISTED TOTAL HYSTERECTOMY WITH BILATERAL SALPINGECTOMY;  Surgeon: Bennye Alm, MD;  Location: [facility];  Service: Gynecology;  Laterality: N/A;   TUBAL LIGATION      Current Outpatient Medications  Medication Sig Dispense Refill   acetaminophen (TYLENOL) 500 MG tablet Take 500 mg by mouth every 6 (six) hours as needed (FOR PAIN.).     aspirin EC 81 MG tablet Take 81 mg by mouth daily.     Multiple Vitamin (MULTIVITAMIN WITH MINERALS) TABS tablet Take 1 tablet by mouth 3 (three) times a week. 2-3 TIMES A WEEK     zolpidem (AMBIEN) 5 MG tablet Take 1 tablet by mouth at bedtime.     No current facility-administered medications for this visit.     ALLERGIES: Doxepin  Family History  Problem Relation Age of Onset   Hypertension Mother    Heart failure Mother    Stroke Mother        Dec d/t complications of stroke at age 60   Alzheimer's disease Father    Diabetes Sister    Hypertension Sister     Diabetes Paternal Grandmother    Breast cancer Neg Hx     Social History   Socioeconomic History   Marital status: Married    Spouse name: Not on file   Number of children: 3   Years of education: Not on file   Highest education level: Not on file  Occupational History    Employer: Designer, fashion/clothing  Tobacco Use   Smoking status: Never   Smokeless tobacco: Never  Vaping Use   Vaping status: Never Used  Substance and Sexual Activity   Alcohol use: Not Currently   Drug use: No   Sexual activity: Yes    Partners: Male    Birth control/protection: Surgical    Comment: Hysterectomy--ovaries remain  Other Topics Concern   Not on file  Social History Narrative   Not on file   Social Determinants of Health   Financial Resource Strain: Low Risk  (02/16/2022)   Received from [facility], [facility]   Overall Financial Resource Strain (CARDIA)    Difficulty of Paying Living Expenses: Not very hard  Food Insecurity: Food Insecurity Present (02/16/2022)   Received from [facility], [facility]   Hunger Vital Sign    Worried About Running Out of Food in the Last Year: Sometimes true    Ran Out of Food in the Last Year: Never true  Transportation Needs: No Transportation Needs (02/16/2022)   Received from [facility], [facility]   PRAPARE - Transportation    In the past 12 months, has lack of transportation kept you from medical appointments or from getting medications?: No    Lack of Transportation (Non-Medical): No  Physical Activity: Not on file  Stress: Not on file  Social Connections: Not on file  Intimate Partner Violence: Not on file    Review of Systems  Genitourinary:  Positive for dysuria and vaginal discharge.    PHYSICAL EXAMINATION:    BP 116/76 (BP Location: Right Arm, Patient Position: Sitting, Cuff Size: Normal)   Pulse 79   Ht 5\' 1"  (1.549 m)    Wt 136 lb (61.7 kg)   LMP 08/25/2012   SpO2 98%   BMI 25.70 kg/m     General appearance: alert, cooperative and appears stated age  Pelvic: External genitalia:  no lesions  Urethra:  normal appearing urethra with no masses, tenderness or lesions              Bartholins and Skenes: normal                 Vagina: normal appearing vagina with normal color and discharge, no lesions              Cervix: absent                Bimanual Exam:  Uterus:  absent              Adnexa: no mass, fullness, tenderness           Chaperone was present for exam:  Warren Lacy, CMA  ASSESSMENT  Dysuria.  Vaginal discharge.  PLAN  Urinalysis:  sg 1.020, pH 5.0, 20 - 40 WBC, 3 - 10 RBC, 2+ protein, nitrites positive, moderate bacteri, moderate mucus UC sent. Bactrim DS po bid x 3 days. Wet prep:  clue cells present, no yeast, no trich. Flagyl 500 mg po bid x 7 days.  FU prn.

## 2022-09-27 NOTE — Patient Instructions (Signed)

## 2022-09-28 LAB — WET PREP FOR TRICH, YEAST, CLUE

## 2022-09-28 NOTE — Addendum Note (Signed)
Addended by: Janean Sark E on: 09/28/2022 05:37 AM   Modules accepted: Orders

## 2022-10-02 ENCOUNTER — Telehealth: Payer: Self-pay | Admitting: *Deleted

## 2022-10-02 NOTE — Telephone Encounter (Signed)
Call returned to patient.  Left message to call [facility] Triage at [phone], option 4.

## 2022-10-12 NOTE — Telephone Encounter (Signed)
No return call from patient. Encounter closed

## 2023-01-29 NOTE — Progress Notes (Unsigned)
57 y.o. [place] Married [place] American female here for annual exam.    PCP: Rayetta Humphrey, MD   Patient's last menstrual period was 08/25/2012.           Sexually active: Yes.    The current method of family planning is status post hysterectomy.    Menopausal hormone therapy:  n/a Exercising: {yes no:314532}  {types:19826} Smoker:  no  OB History  Gravida Para Term Preterm AB Living  4 3 3   1 3   SAB IAB Ectopic Multiple Live Births               # Outcome Date GA Lbr Len/2nd Weight Sex Type Anes PTL Lv  4 AB           3 Term           2 Term           1 Term             Obstetric Comments  Menstrual age: 52    Age 1st Pregnancy: 62     HEALTH MAINTENANCE: Last 2 paps:  11-12-14 Neg:Neg HR HPV  History of abnormal Pap or positive HPV:  yes, Hx cryotherapy to cervix 1980's--paps normal since  Mammogram:   04/11/22 Breast Density Cat C, BI-RADS CAT 1 neg Colonoscopy:  08-08-19 normal;next 5 yrs d/t hx of polyps  Bone Density:  n/a  Result  n/a   Immunization History  Administered Date(s) Administered   PFIZER Comirnaty(Gray Top)Covid-19 Tri-Sucrose Vaccine 03/10/2020   PFIZER(Purple Top)SARS-COV-2 Vaccination 05/30/2019, 06/23/2019   Tdap 11/12/2014      reports that she has never smoked. She has never used smokeless tobacco. She reports that she does not currently use alcohol. She reports that she does not use drugs.  Past Medical History:  Diagnosis Date   Abnormal Pap smear of cervix    -1980s hx of colposcopy and cryotherapy to cervix--paps normal since   Anemia 2014   prior to Hysterectomy--because of fibroid   Cancer ([facility]) 02/2017   DCIS--Breast CA- Left   COVID 03/2019   Fibroid    History of hiatal hernia    small   Leukopenia    Lipoma of back    Migraine    h/o migraines prior to hysterectomy   Other specified diseases of liver    Personal history of radiation therapy 2019   Left breast   Seborrheic keratoses     Past Surgical History:   Procedure Laterality Date   ABDOMINAL HYSTERECTOMY  2014   APPENDECTOMY  1986   BREAST BIOPSY Left 03/29/2017   4:00 coil clip DCIS   BREAST BIOPSY Left 03/29/2017   subareolar wing clip- fibroadenoma   BREAST BIOPSY Left 04/11/2017   9:00 ribbon clip-fibroadenoma   BREAST CYST EXCISION Left 1982   3 cyst removed   BREAST CYST EXCISION Right 1982   4 cyst removed   BREAST LUMPECTOMY Left 05/16/2017   Wide excision 12 mm area high grade DCIS, 1 mm anterior margin(subcutaneous fat). ER 51-90%/ PR 1-10%.  Surgeon: Earline Mayotte, MD;  Location: [facility];  Service: General;  Laterality: Left;   CESAREAN SECTION     x 3   COLONOSCOPY W/ BIOPSIES  12/29/2015   Tubular adenoma without atypica, Arty Baumgartner, [place],.   COLONOSCOPY WITH PROPOFOL N/A 08/08/2019   Procedure: COLONOSCOPY WITH PROPOFOL;  Surgeon: Earline Mayotte, MD;  Location: [facility] END[facility]COPY;  Service: Endoscopy;  Laterality: N/A;  CYSTOSCOPY N/A 10/08/2012   Procedure: CYSTOSCOPY;  Surgeon: Bennye Alm, MD;  Location: [facility];  Service: Gynecology;  Laterality: N/A;   ESOPHAGOGASTRODUODENOSCOPY     ESOPHAGOGASTRODUODENOSCOPY (EGD) WITH PROPOFOL N/A 08/02/2018   Procedure: ESOPHAGOGASTRODUODENOSCOPY (EGD) WITH PROPOFOL;  Surgeon: Christena Deem, MD;  Location: [facility] ENDOSCOPY;  Service: Endoscopy;  Laterality: N/A;   ROBOTIC ASSISTED TOTAL HYSTERECTOMY N/A 10/08/2012   Procedure: ROBOTIC ASSISTED TOTAL HYSTERECTOMY WITH BILATERAL SALPINGECTOMY;  Surgeon: Bennye Alm, MD;  Location: [facility];  Service: Gynecology;  Laterality: N/A;   TUBAL LIGATION      Current Outpatient Medications  Medication Sig Dispense Refill   acetaminophen (TYLENOL) 500 MG tablet Take 500 mg by mouth every 6 (six) hours as needed (FOR PAIN.).     aspirin EC 81 MG tablet Take 81 mg by mouth daily.     metroNIDAZOLE (FLAGYL) 500 MG tablet Take 1 tablet (500 mg total) by mouth 2 (two) times daily. 14 tablet 0   Multiple Vitamin (MULTIVITAMIN WITH  MINERALS) TABS tablet Take 1 tablet by mouth 3 (three) times a week. 2-3 TIMES A WEEK     sulfamethoxazole-trimethoprim (BACTRIM DS) 800-160 MG tablet Take 1 tablet by mouth 2 (two) times daily. One PO BID x 3 days 6 tablet 0   zolpidem (AMBIEN) 5 MG tablet Take 1 tablet by mouth at bedtime.     No current facility-administered medications for this visit.    ALLERGIES: Doxepin  Family History  Problem Relation Age of Onset   Hypertension Mother    Heart failure Mother    Stroke Mother        Dec d/t complications of stroke at age 51   Alzheimer's disease Father    Diabetes Sister    Hypertension Sister    Diabetes Paternal Grandmother    Breast cancer Neg Hx     Review of Systems  PHYSICAL EXAM:  LMP 08/25/2012     General appearance: alert, cooperative and appears stated age Head: normocephalic, without obvious abnormality, atraumatic Neck: no adenopathy, supple, symmetrical, trachea midline and thyroid normal to inspection and palpation Lungs: clear to auscultation bilaterally Breasts: normal appearance, no masses or tenderness, No nipple retraction or dimpling, No nipple discharge or bleeding, No axillary adenopathy Heart: regular rate and rhythm Abdomen: soft, non-tender; no masses, no organomegaly Extremities: extremities normal, atraumatic, no cyanosis or edema Skin: skin color, texture, turgor normal. No rashes or lesions Lymph nodes: cervical, supraclavicular, and axillary nodes normal. Neurologic: grossly normal  Pelvic: External genitalia:  no lesions              No abnormal inguinal nodes palpated.              Urethra:  normal appearing urethra with no masses, tenderness or lesions              Bartholins and Skenes: normal                 Vagina: normal appearing vagina with normal color and discharge, no lesions              Cervix: no lesions              Pap taken: {yes no:314532} Bimanual Exam:  Uterus:  normal size, contour, position, consistency,  mobility, non-tender              Adnexa: no mass, fullness, tenderness              Rectal  exam: {yes no:314532}.  Confirms.              Anus:  normal sphincter tone, no lesions  Chaperone was present for exam:  {BSCHAPERONE:31226::"Rennae Ferraiolo F, CMA"}  ASSESSMENT: Well woman visit with gynecologic exam  ***  PLAN: Mammogram screening discussed. Self breast awareness reviewed. Pap and HRV collected:  {yes no:314532} Guidelines for Calcium, Vitamin D, regular exercise program including cardiovascular and weight bearing exercise. Medication refills:  *** {LABS (Optional):23779} Follow up:  ***    Additional counseling given.  {yes [id number]. ***  total time was spent for this patient encounter, including preparation, face-to-face counseling with the patient, coordination of care, and documentation of the encounter in addition to doing the well woman visit with gynecologic exam.

## 2023-02-12 ENCOUNTER — Ambulatory Visit: Payer: BLUE CROSS/BLUE SHIELD | Admitting: Obstetrics and Gynecology

## 2023-02-22 ENCOUNTER — Other Ambulatory Visit: Payer: Self-pay | Admitting: Surgery

## 2023-02-22 DIAGNOSIS — Z1231 Encounter for screening mammogram for malignant neoplasm of breast: Secondary | ICD-10-CM
